# Patient Record
Sex: Male | Born: 1950 | Race: White | Hispanic: No | Marital: Married
Health system: Southern US, Community
[De-identification: ages and names within clinical notes are randomized; demographics above are authoritative.]

## PROBLEM LIST (undated history)

## (undated) DIAGNOSIS — C4491 Basal cell carcinoma of skin, unspecified: Secondary | ICD-10-CM

## (undated) DIAGNOSIS — L570 Actinic keratosis: Secondary | ICD-10-CM

## (undated) HISTORY — DX: Basal cell carcinoma of skin, unspecified: C44.91

## (undated) HISTORY — DX: Actinic keratosis: L57.0

## (undated) HISTORY — PX: OTHER SURGICAL HISTORY: SHX169

---

## 2013-01-23 ENCOUNTER — Ambulatory Visit: Payer: Self-pay | Admitting: Sports Medicine

## 2013-02-24 ENCOUNTER — Ambulatory Visit: Payer: Self-pay | Admitting: Orthopedic Surgery

## 2013-02-24 LAB — PROTIME-INR: Prothrombin Time: 13.4 secs (ref 11.5–14.7)

## 2018-07-19 ENCOUNTER — Other Ambulatory Visit: Payer: Self-pay | Admitting: Sports Medicine

## 2018-07-19 DIAGNOSIS — M7918 Myalgia, other site: Secondary | ICD-10-CM

## 2018-07-19 DIAGNOSIS — M25552 Pain in left hip: Secondary | ICD-10-CM

## 2018-07-19 DIAGNOSIS — M7072 Other bursitis of hip, left hip: Secondary | ICD-10-CM

## 2018-08-09 ENCOUNTER — Ambulatory Visit
Admission: RE | Admit: 2018-08-09 | Discharge: 2018-08-09 | Disposition: A | Discharge status: Home / Self Care | Payer: Medicare Other | Source: Ambulatory Visit | Attending: Sports Medicine | Admitting: Sports Medicine

## 2018-08-09 ENCOUNTER — Encounter (INDEPENDENT_AMBULATORY_CARE_PROVIDER_SITE_OTHER): Payer: Self-pay

## 2018-08-09 DIAGNOSIS — M25552 Pain in left hip: Secondary | ICD-10-CM | POA: Diagnosis not present

## 2018-08-09 DIAGNOSIS — S73102A Unspecified sprain of left hip, initial encounter: Secondary | ICD-10-CM | POA: Diagnosis not present

## 2018-08-09 DIAGNOSIS — M7918 Myalgia, other site: Secondary | ICD-10-CM

## 2018-08-09 DIAGNOSIS — X58XXXA Exposure to other specified factors, initial encounter: Secondary | ICD-10-CM | POA: Diagnosis not present

## 2018-08-09 DIAGNOSIS — M7072 Other bursitis of hip, left hip: Secondary | ICD-10-CM | POA: Diagnosis not present

## 2019-08-23 ENCOUNTER — Other Ambulatory Visit: Payer: Self-pay | Admitting: Orthopedic Surgery

## 2019-08-23 DIAGNOSIS — M25552 Pain in left hip: Secondary | ICD-10-CM

## 2019-08-25 ENCOUNTER — Ambulatory Visit
Admission: RE | Admit: 2019-08-25 | Discharge: 2019-08-25 | Disposition: A | Discharge status: Home / Self Care | Payer: Medicare HMO | Source: Ambulatory Visit | Attending: Orthopedic Surgery | Admitting: Orthopedic Surgery

## 2019-08-25 ENCOUNTER — Ambulatory Visit: Payer: PRIVATE HEALTH INSURANCE

## 2019-08-25 ENCOUNTER — Other Ambulatory Visit: Payer: Self-pay

## 2019-08-25 DIAGNOSIS — M25552 Pain in left hip: Secondary | ICD-10-CM | POA: Diagnosis not present

## 2019-08-25 MED ORDER — TRIAMCINOLONE ACETONIDE 40 MG/ML IJ SUSP (RADIOLOGY)
40.0000 mg | Freq: Once | INTRAMUSCULAR | Status: AC
  Administered 2019-08-25: 11:00:00 40 mg via INTRA_ARTICULAR

## 2019-08-25 MED ORDER — ROPIVACAINE HCL 5 MG/ML IJ SOLN
15.0000 mL | Freq: Once | INTRAMUSCULAR | Status: AC
  Administered 2019-08-25: 11:00:00 8 mL via INTRA_ARTICULAR
  Filled 2019-08-25: qty 15

## 2019-08-25 MED ORDER — LIDOCAINE HCL (PF) 1 % IJ SOLN
5.0000 mL | Freq: Once | INTRAMUSCULAR | Status: AC
  Administered 2019-08-25: 11:00:00 5 mL
  Filled 2019-08-25: qty 5

## 2019-08-25 MED ORDER — IOHEXOL 180 MG/ML  SOLN
20.0000 mL | Freq: Once | INTRAMUSCULAR | Status: AC | PRN
  Administered 2019-08-25: 11:00:00 2 mL via INTRA_ARTICULAR

## 2019-09-22 ENCOUNTER — Other Ambulatory Visit: Payer: Self-pay | Admitting: Orthopedic Surgery

## 2019-09-22 DIAGNOSIS — M25552 Pain in left hip: Secondary | ICD-10-CM

## 2019-09-27 ENCOUNTER — Other Ambulatory Visit: Payer: Self-pay

## 2019-09-27 ENCOUNTER — Ambulatory Visit
Admission: RE | Admit: 2019-09-27 | Discharge: 2019-09-27 | Disposition: A | Discharge status: Home / Self Care | Payer: Medicare HMO | Source: Ambulatory Visit | Attending: Orthopedic Surgery | Admitting: Orthopedic Surgery

## 2019-09-27 ENCOUNTER — Ambulatory Visit: Payer: Medicare HMO

## 2019-09-27 DIAGNOSIS — M25552 Pain in left hip: Secondary | ICD-10-CM

## 2019-09-27 MED ORDER — IOHEXOL 300 MG/ML  SOLN
1.0000 mL | Freq: Once | INTRAMUSCULAR | Status: AC | PRN
  Administered 2019-09-27: 1 mL

## 2019-09-27 MED ORDER — ROPIVACAINE HCL 5 MG/ML IJ SOLN
INTRAMUSCULAR | Status: AC
  Filled 2019-09-27: qty 30

## 2019-09-27 MED ORDER — TRIAMCINOLONE ACETONIDE 40 MG/ML IJ SUSP
INTRAMUSCULAR | Status: AC
  Filled 2019-09-27: qty 1

## 2019-09-27 MED ORDER — TRIAMCINOLONE ACETONIDE 40 MG/ML IJ SUSP
40.0000 mg | Freq: Once | INTRAMUSCULAR | Status: AC
  Administered 2019-09-27: 15:00:00 40 mg via INTRAMUSCULAR
  Filled 2019-09-27: qty 1

## 2019-09-27 MED ORDER — ROPIVACAINE HCL 5 MG/ML IJ SOLN
2.0000 mL | Freq: Once | INTRAMUSCULAR | Status: AC
  Administered 2019-09-27: 2 mL
  Filled 2019-09-27: qty 2

## 2020-07-31 ENCOUNTER — Ambulatory Visit: Payer: Medicare HMO | Admitting: Dermatology

## 2020-08-13 ENCOUNTER — Ambulatory Visit: Payer: Medicare HMO | Admitting: Dermatology

## 2020-08-13 ENCOUNTER — Encounter: Payer: Self-pay | Admitting: Dermatology

## 2020-08-13 ENCOUNTER — Other Ambulatory Visit: Payer: Self-pay

## 2020-08-13 DIAGNOSIS — L820 Inflamed seborrheic keratosis: Secondary | ICD-10-CM | POA: Diagnosis not present

## 2020-08-13 DIAGNOSIS — D1800 Hemangioma unspecified site: Secondary | ICD-10-CM

## 2020-08-13 DIAGNOSIS — Z1283 Encounter for screening for malignant neoplasm of skin: Secondary | ICD-10-CM

## 2020-08-13 DIAGNOSIS — D239 Other benign neoplasm of skin, unspecified: Secondary | ICD-10-CM

## 2020-08-13 DIAGNOSIS — L814 Other melanin hyperpigmentation: Secondary | ICD-10-CM

## 2020-08-13 DIAGNOSIS — D692 Other nonthrombocytopenic purpura: Secondary | ICD-10-CM | POA: Diagnosis not present

## 2020-08-13 DIAGNOSIS — D229 Melanocytic nevi, unspecified: Secondary | ICD-10-CM

## 2020-08-13 DIAGNOSIS — L578 Other skin changes due to chronic exposure to nonionizing radiation: Secondary | ICD-10-CM

## 2020-08-13 DIAGNOSIS — D2372 Other benign neoplasm of skin of left lower limb, including hip: Secondary | ICD-10-CM

## 2020-08-13 DIAGNOSIS — Z85828 Personal history of other malignant neoplasm of skin: Secondary | ICD-10-CM | POA: Diagnosis not present

## 2020-08-13 DIAGNOSIS — L821 Other seborrheic keratosis: Secondary | ICD-10-CM

## 2020-08-13 NOTE — Progress Notes (Signed)
Follow-Up Visit   Subjective  Alejandro Williamson is a 69 y.o. male who presents for the following: TBSE.  Patient presents today for TBSE, has a few areas of concern on B/L forearms (bruising), back, chest, and B/L legs. Patient has h/o [facility] 11/18/2009 R. Medial forehead @sup  edge of previous BCC, BCC Right Medial forehead 04/09/09.  New spot on L chest also irritated spot behind R ear.  He takes aspirin daily and had had multiple steroid shots for hip over past year.  The following portions of the chart were reviewed this encounter and updated as appropriate:      Review of Systems:  No other skin or systemic complaints except as noted in HPI or Assessment and Plan.  Objective  Well appearing patient in no apparent distress; mood and affect are within normal limits.  A full examination was performed including scalp, head, eyes, ears, nose, lips, neck, chest, axillae, abdomen, back, buttocks, bilateral upper extremities, bilateral lower extremities, hands, feet, fingers, toes, fingernails, and toenails. All findings within normal limits unless otherwise noted below.  Objective  Left Forearm - Anterior, Right Forearm - Anterior: Violaceous macules and patches.   Objective  Left Ankle: Firm pink/brown papulenodule with dimple sign.   Objective  Left Breast, Right Postauricular Area: Erythematous keratotic or waxy stuck-on papule    Assessment & Plan  Senile purpura (HCC) (2) Left Forearm - Anterior; Right Forearm - Anterior  Purpura - Violaceous macules and patches arms - Benign - Related to age, sun damage and/or use of blood thinners, also due to systemic steroids (h/o steroid shots for hip) - Observe, Recommend daily broad spectrum sunscreen SPF 30+ to sun-exposed areas - Can use OTC arnica containing moisturizer such as Dermend Bruise Formula if desired - Call for worsening or other concerns   Dermatofibroma Left Ankle  Benign, observe.    Inflamed seborrheic keratosis  (2) Left Breast; Right Postauricular Area  Cryotherapy today Prior to procedure, discussed risks of blister formation, small wound, skin dyspigmentation, or rare scar following cryotherapy.    Destruction of lesion - Left Breast, Right Postauricular Area  Destruction method: cryotherapy   Informed consent: discussed and consent obtained   Lesion destroyed using liquid nitrogen: Yes   Region frozen until ice ball extended beyond lesion: Yes   Outcome: patient tolerated procedure well with no complications   Post-procedure details: wound care instructions given     Lentigines - Scattered tan macules - Discussed due to sun exposure - Benign, observe - Call for any changes  Seborrheic Keratoses - Stuck-on, waxy, tan-brown papules and plaques  - Discussed benign etiology and prognosis. - Observe - Call for any changes  Melanocytic Nevi - Tan-brown and/or pink-flesh-colored symmetric macules and papules - Benign appearing on exam today - Observation - Call clinic for new or changing moles - Recommend daily use of broad spectrum spf 30+ sunscreen to sun-exposed areas.   Hemangiomas - Red papules - Discussed benign nature - Observe - Call for any changes  Actinic Damage - diffuse scaly erythematous macules with underlying dyspigmentation - Recommend daily broad spectrum sunscreen SPF 30+ to sun-exposed areas, reapply every 2 hours as needed.  - Call for new or changing lesions.  History of Basal Cell Carcinoma of the Forehead - No evidence of recurrence today - Recommend regular full body skin exams - Recommend daily broad spectrum sunscreen SPF 30+ to sun-exposed areas, reapply every 2 hours as needed.  - Call if any new or changing lesions are  noted between office visits  Skin cancer screening performed today.   Return in about 1 year (around 08/13/2021) for TBSE.  I, Donzetta Kohut, CMA, am acting as scribe for Brendolyn Patty, MD . Documentation: I have reviewed the  above documentation for accuracy and completeness, and I agree with the above.  Brendolyn Patty MD

## 2020-08-13 NOTE — Patient Instructions (Addendum)

## 2021-01-14 ENCOUNTER — Other Ambulatory Visit: Payer: Self-pay | Admitting: Sports Medicine

## 2021-01-14 DIAGNOSIS — M76891 Other specified enthesopathies of right lower limb, excluding foot: Secondary | ICD-10-CM

## 2021-01-14 DIAGNOSIS — M7072 Other bursitis of hip, left hip: Secondary | ICD-10-CM

## 2021-01-14 DIAGNOSIS — M25552 Pain in left hip: Secondary | ICD-10-CM

## 2021-01-14 DIAGNOSIS — M1612 Unilateral primary osteoarthritis, left hip: Secondary | ICD-10-CM

## 2021-01-14 DIAGNOSIS — M76899 Other specified enthesopathies of unspecified lower limb, excluding foot: Secondary | ICD-10-CM

## 2021-01-20 ENCOUNTER — Other Ambulatory Visit: Payer: Self-pay

## 2021-01-20 ENCOUNTER — Ambulatory Visit
Admission: RE | Admit: 2021-01-20 | Discharge: 2021-01-20 | Disposition: A | Discharge status: Home / Self Care | Payer: Medicare HMO | Source: Ambulatory Visit | Attending: Sports Medicine | Admitting: Sports Medicine

## 2021-01-20 DIAGNOSIS — M76892 Other specified enthesopathies of left lower limb, excluding foot: Secondary | ICD-10-CM | POA: Insufficient documentation

## 2021-01-20 DIAGNOSIS — M1612 Unilateral primary osteoarthritis, left hip: Secondary | ICD-10-CM | POA: Insufficient documentation

## 2021-01-20 DIAGNOSIS — M25552 Pain in left hip: Secondary | ICD-10-CM | POA: Diagnosis not present

## 2021-01-20 DIAGNOSIS — M76891 Other specified enthesopathies of right lower limb, excluding foot: Secondary | ICD-10-CM | POA: Insufficient documentation

## 2021-01-20 DIAGNOSIS — M7072 Other bursitis of hip, left hip: Secondary | ICD-10-CM | POA: Diagnosis present

## 2021-01-20 DIAGNOSIS — M76899 Other specified enthesopathies of unspecified lower limb, excluding foot: Secondary | ICD-10-CM | POA: Insufficient documentation

## 2021-01-28 ENCOUNTER — Other Ambulatory Visit: Payer: Self-pay

## 2021-01-28 ENCOUNTER — Encounter: Payer: Self-pay | Admitting: Physical Therapy

## 2021-01-28 ENCOUNTER — Ambulatory Visit: Payer: Medicare HMO | Attending: Sports Medicine | Admitting: Physical Therapy

## 2021-01-28 DIAGNOSIS — M533 Sacrococcygeal disorders, not elsewhere classified: Secondary | ICD-10-CM | POA: Diagnosis not present

## 2021-01-28 DIAGNOSIS — M2170 Unequal limb length (acquired), unspecified site: Secondary | ICD-10-CM | POA: Insufficient documentation

## 2021-01-28 DIAGNOSIS — R2689 Other abnormalities of gait and mobility: Secondary | ICD-10-CM | POA: Insufficient documentation

## 2021-01-28 NOTE — Therapy (Signed)
[place] MAIN [facility] SERVICES [address] [place], [place], [address] Phone: [phone]   Fax:  [phone]  Physical Therapy Evaluation  Patient Details  Name: Alejandro Williamson MRN: [id number] Date of Birth: 1951-06-12 Referring Provider (PT): Candelaria Stagers MD   Encounter Date: 01/28/2021   PT End of Session - 01/28/21 1938    Visit Number 1    Number of Visits 10   eval 01/28/21   Date for PT Re-Evaluation 04/08/21    PT Start Time 1700    PT Stop Time 1820    PT Time Calculation (min) 80 min           Past Medical History:  Diagnosis Date  . Basal cell carcinoma 04/09/2009, 11/18/2009   right med forehead 3cm sup to right medial brow    History reviewed. No pertinent surgical history.  There were no vitals filed for this visit.    Subjective Assessment - 01/28/21 1713    Subjective 1)  L posterior thigh pain: Pt started to notice L hip / buttock in 2019 like "he was sitting on a rock".   It came on suddenly after he bough a new car ( SUV) and all of a sudden he could not sit in it after a long drive from [place] ( 5 hours). He experienced radiating along pelvic area and L glut.  Pain occurred with sitting any surfaces that had a give to it and he sinks into the seat. Pain resolves with standing. Pain also occurred with lying flat or when buttocks get compressed in a certain way ( crossing ankles while watching TV on his back).  Pain a resolves when sitting on his toilet.   Denied urinary frequency, urgency, difficulty with initiating / completing urination, constipation.  Denied numbness/ tingling along pelvic area. Denied fall onto taibone, stepping into ditch/ curb, sprained ankles.   But pt noticed around the same time his L posterior thigh pain has been occuring, he would experience numbness along  B thighs above knee after standing for long periods 30-40 min.    Across the past 3 years, Sx have remained at a level of 4-10/10. Pt drove his  wife car and pain was increased to 10/10 but it decreased after 4 days to 4-5/10.  Pt adjusted his activities due to pain. Pt has missed church because he is not able to sit through a whole service , not drove to [place] for 5 hours to see his grandparents. Pt's wife depends on him for driving.  Pt received 20 visits of physical therapy and experienced 50% -70% relief and pt still does his HEP but one exercise with stepping caused L labral pain.   2) Recurrent L labral pain along thigh to knee:       L labral tear occurred in 2017 with pain along lateral leg which he treated with medications and did not undergo PT. The medications helped with the labral pain. It flares up every now and then. Flare-ups 5-6/10, currently 1/10 Hx of running for 5 miles / day between [age] years but did not have a flexibility routine.  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 One exercise with stepping prescribed by another PT caused L labral pain. Physical activity: 10K steps with wife.     Patient Stated Goals be able to drive to [facility] and see grandkids    Currently in Pain? Yes    Pain Score 3  Pain Location Buttocks    Pain Descriptors / Indicators Aching;Burning              [facility] PT Assessment - 01/28/21 1711      Assessment   Medical Diagnosis L thigh posterior pain    Referring Provider (PT) Candelaria Stagers MD      Precautions   Precautions None      Restrictions   Weight Bearing Restrictions No      Balance Screen   Has the patient fallen in the past 6 months Yes   fell on L side on ice, MDs have examined it, it is getting better   How many times? 1   three weeks ago   Has the patient had a decrease in activity level because of a fear of falling?  No    Is the patient reluctant to leave their home because of a fear of falling?  No      Prior Function   Level of Independence Independent      Observation/Other Assessments   Observations ankles crossed, sacral sitting. Provided folded  towel for anterior tilt and cued for feet on ground: pt reported discomfort but no pain.    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, L ilac crest higher, R thorax anterior rotation      AROM   Overall AROM Comments L sidebend and rotation less > R ( post Tx: shoe lift in L shoe)      PROM   Overall PROM Comments sidelying hip hip measurement: R hip ext 10 deg, L 20 deg      Strength   Overall Strength Comments R hip abd/ glut, hip flexion/ DF/EV and toe ext  3+/5, L 4+/5, knee ext/flex B 4+/5      Palpation   Spinal mobility hypomobile thoracic segments T7-12, R teres minor /medial scapula tight ( post Tx: improved arm swing/ thoracic rotation in gait)    SI assessment  standing: L iliac crest / shoulder higher    Palpation comment supine: B ASIS levelled, medial malleoli/ patella L higher ( no change in standing/ supine after addressing thoracic spinal deviations)      Ambulation/Gait   Gait Comments pre Tx: limited R anterior rotation with swing, L pelvic  sway, limited arm swing/ thoracic rotation postTx: more arm swing/ pelvic sway still present without shoe lift,  reciporcal gait pattern armswings with shoe lift in L shoe                      Objective measurements completed on examination: See above findings.       [place] Adult PT Treatment/Exercise - 01/28/21 1711      Therapeutic Activites    Other Therapeutic Activities explaiend role of pelvic alignment and spinal deviations to Sx, explained anatomy/ physiology, goals, role of shoe lift to adjust for leg length difference, provided sitting posture modifications with folded towel to promote anterior tilt of pelvis instead of slumped sitting.       Neuro Re-ed    Neuro Re-ed Details  cued for HEP, sitting posture,      Manual Therapy   Manual therapy comments STM/MWM to address hypomobile thoracic segments T7-12, R teres minor /medial scapula tight ( post Tx: improved arm swing/ thoracic rotation in gait)                        PT Long Term Goals - 01/28/21 1723  PT LONG TERM GOAL #1   Title Pt will be able to sit for 30 min on a hard surface in order to prepare to return to church and sit through a whole service    Baseline no able to sit 30 min    Time 8    Period Weeks    Status New    Target Date 03/25/21      PT LONG TERM GOAL #2   Title Pt will report no  numbness along  B thighs above knee after standing for long periods 30-40 min in order to perform ADLs    Baseline numbness along  B thighs above knee after standing for long periods 30-40 min    Time 8    Period Weeks    Status New    Target Date 03/25/21      PT LONG TERM GOAL #3   Title Pt will be IND with car seat modifications in order to ride in variable car seat s in order to engage in voluteer activities and support wife who depends on him for driving    Baseline not IND    Time 4    Period Weeks    Status New    Target Date 02/25/21      PT LONG TERM GOAL #4   Title Pt will decrease his FOTO pain score from 50 pts to < 25 pts in order to improve QOL    Baseline 50pts    Time 10    Period Weeks    Status New    Target Date 04/08/21      PT LONG TERM GOAL #5   Title Pt will demo equal pelvic girdle height and less spinal deviations, and more reciprocal gait pattern    Baseline L illiac crest higher,  limited R anterior pelvic rotation with swing phase    Time 2    Period Weeks    Status New    Target Date 02/11/21      Additional Long Term Goals   Additional Long Term Goals Yes      PT LONG TERM GOAL #6   Title Pt will demo increased strength on RLE from 3+/5 to > 4/5 hip flexion, hip abd, hip ext, and increased hip ext from 10 deg to > 20 deg ( L 20 deg) ( tested in sidelying) in order to walk 10K steps with wife    Baseline 3+/5 hip flexion, hip abd, hip ext    Time 6    Period Weeks    Status New    Target Date 03/11/21                  Plan - 01/28/21 1941    Clinical  Impression Statement Pt is a 70 yo who presents with L glut pain since 2019 which has impacted his ability to participate in social activities such as sitting through a church service and riding to visit grandchildren who live 5 hours away.   Pt completed 20 visits of physical therapy which he reports to have helped with his pain by 50-75% pain.   Pt also presents with recurrent L hip pain related to  L labral tear that occurred in 2017.  Pt's musculoskeletal assessment today revealed unequal leg length height, uneven pelvic girdle and shoulder height, limited spinal /pelvic mobility, dyscoordination and strength of pelvic floor mm, weak mm on RLE, and gait deviations, and poor sitting posture.   Pt was provided education on  etiology of Sx with anatomy, physiology explanation with images along with the benefits of customized pelvic PT Tx based on pt's medical conditions and musculoskeletal deficits.  Explained the importance of restoring equal pelvic girdle alignment and its impact on sitting and gait.   Regional interdependent approaches will yield greater benefits in pt's POC due to the complexity of pt's medical Hx and the significant impact their Sx have had on their QOL. Pt would benefit from a biopsychosocial approach to yield optimal outcomes as pt just started medication to help with the stress this Sx has had on his life. Plan to build interdisciplinary team with pt's providers to optimize patient-centered care.   Following Tx today which pt tolerated without complaints, pt demo'd increased thoracic rotation/ mobility with increased arm swing following manual Tx at thoracic spine.  He demo'd equal alignment of pelvic girdle with a shoe lift placed in L shoe and a more reciprocal gait pattern. Pt was educated to sit with anterior tilt of pelvis with a folded towel which helped to decrease his pain.  Plan to monitor the effective of shoe lift and show modifications to car seats at next session.   Anticipate by addressing leg length difference, L glut pain and L labral tear related pain may improve further.   Customized manual Tx, propioception and neuromuscular training, and specific strengthening, plus neuroscience pain education will add to pt's prognosis.     Examination-Activity Limitations Stand;Sit    Clinical Decision Making Moderate    Rehab Potential Good    PT Frequency 1x / week    PT Duration Other (comment)   10   PT Treatment/Interventions ADLs/Self Care Home Management;Stair training;Therapeutic activities;Therapeutic exercise;Moist Heat;Neuromuscular re-education;Balance training;Scar mobilization;Manual techniques;Functional mobility training;Traction;Cryotherapy;Dry needling;Patient/family education;Compression bandaging;Taping;Splinting;Gait training;Energy conservation;Spinal Manipulations;Joint Manipulations    Consulted and Agree with Plan of Care Patient           Patient will benefit from skilled therapeutic intervention in order to improve the following deficits and impairments:  Hypomobility,Decreased strength,Decreased range of motion,Decreased endurance,Increased muscle spasms,Decreased scar mobility,Decreased mobility,Decreased coordination,Abnormal gait,Decreased activity tolerance,Improper body mechanics,Pain,Postural dysfunction  Visit Diagnosis: Sacrococcygeal disorders, not elsewhere classified  Unequal leg length  Other abnormalities of gait and mobility     Problem List There are no problems to display for this patient.   Jerl Mina 01/28/2021, 8:03 PM  [place] MAIN [facility] SERVICES [address] [place], [place], [address] Phone: [phone]   Fax:  [phone]  Name: Alejandro Williamson MRN: [id number] Date of Birth: November 09, 1951

## 2021-01-28 NOTE — Patient Instructions (Signed)
  Lengthen Back rib by R  shoulder    Lie on L  side , pillow between knees and under head  Pull  arm overhead over mattress, grab the edge of mattress,pull it upward, drawing elbow away from ears  Breathing 10 reps  Open book (handout)  Lying on  _ side , rotating  __ only this week  Rotating onto pillow /yoga block  Pillow/ Block between knees  10 reps  __   Wear shoe lift in L shoe and if causes pain, remove it

## 2021-01-30 ENCOUNTER — Encounter: Payer: Self-pay | Admitting: Physical Therapy

## 2021-01-30 NOTE — Telephone Encounter (Signed)
DPT phoned patient and listened to his report of R radiating pain down RLE since wearing shoe lift in L shoe. Pt was advised to remove the shoe and asked pt to email DPT an update.  His next appt was expedited to Monday instead of Thursday of next week. Pt voiced understanding.

## 2021-02-03 ENCOUNTER — Ambulatory Visit: Payer: Medicare HMO | Admitting: Physical Therapy

## 2021-02-03 ENCOUNTER — Other Ambulatory Visit: Payer: Self-pay

## 2021-02-03 DIAGNOSIS — M533 Sacrococcygeal disorders, not elsewhere classified: Secondary | ICD-10-CM | POA: Diagnosis not present

## 2021-02-03 DIAGNOSIS — R2689 Other abnormalities of gait and mobility: Secondary | ICD-10-CM

## 2021-02-03 NOTE — Patient Instructions (Addendum)
    Neck / shoulder stretches:    Lying on back - small sushi roll towel under neck  _ 6 directions  5 reps    _angel wings, lower elbows down , keep arms touching bed  10 reps       __  Avoid straining pelvic floor, abdominal muscles , spine  Use log rolling technique instead of getting out of bed with your neck or the sit-up     Log rolling into and out of bed   Log rolling into and out of bed If getting out of bed on R side, Bent knees, scoot hips/ shoulder to L  Raise R arm completely overhead, rolling onto armpit  Then lower bent knees to bed to get into complete side lying position  Then drop legs off bed, and push up onto R elbow/forearm, and use L hand to push onto the bed

## 2021-02-03 NOTE — Therapy (Signed)
[place] MAIN [facility] SERVICES [address] [place], [place], [address] Phone: [phone]   Fax:  [phone]  Physical Therapy Treatment  Patient Details  Name: Alejandro Williamson MRN: [id number] Date of Birth: 1951-05-21 Referring Provider (PT): Candelaria Stagers MD   Encounter Date: 02/03/2021   PT End of Session - 02/03/21 0858    Visit Number 2    Number of Visits 10   eval 01/28/21   Date for PT Re-Evaluation 04/08/21    PT Start Time 0900    PT Stop Time 1000    PT Time Calculation (min) 60 min           Past Medical History:  Diagnosis Date  . Basal cell carcinoma 04/09/2009, 11/18/2009   right med forehead 3cm sup to right medial brow    No past surgical history on file.  There were no vitals filed for this visit.   Subjective Assessment - 02/03/21 0909    Subjective The day after wearing shoelift on L shoe, pt experienced radiating pain down the R leg. After he removed it, he felt no more radiating pain. The past 2 days pt felt better with sitting by 20% but he also didn't sit much.  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, L lumbar convex curve , L ilac crest higher, R thorax anterior rotation      AROM   Overall AROM Comments R cervical rotation: 50 deg, L 55 deg seated,  supine,: L cervical rotation 45 deg/ 60, R 40/ 50  deg      Palpation   Spinal mobility signficant tightness at medial scapula, teres minor/ subscap, hypmobile T7-10 , T3-5, tightness/ upertrap B, hypomobile C5-7 and paraspinals from cervical spine to thoracic B      Special Tests   Other special tests functional reach in prone: R thumb at L 3, L thumb at L 1      Ambulation/Gait   Gait Comments without shoe lift: decreased stance on R, slightly lowered R  shoulder, speed 1.3 m/s  after HEP for thoracic deficits and removeal of shoe lift                         [facility] Adult PT Treatment/Exercise - 02/03/21 1660      Neuro Re-ed    Neuro Re-ed Details  cued for HEP , educated on no longer needing shoe lift      Manual Therapy   Manual therapy comments STM/MWM at problem areas noted in assessment ( sidelying, prone positions. Further manual Tx at cervial spine is needed next sessino                       PT Long Term Goals - 02/03/21 0910      PT LONG TERM GOAL #1   Title Pt will be able to sit for 30 min on a hard surface in order to prepare to return to church and sit through a whole service    Baseline no able to sit 30 min    Time 8    Period Weeks    Status On-going  PT LONG TERM GOAL #2   Title Pt will report no  numbness along  B thighs above knee after standing for long periods 30-40 min in order to perform ADLs    Baseline numbness along  B thighs above knee after standing for long periods 30-40 min    Time 8    Period Weeks    Status On-going      PT LONG TERM GOAL #3   Title Pt will be IND with car seat modifications in order to ride in variable car seat s in order to engage in voluteer activities and support wife who depends on him for driving    Baseline not IND    Time 4    Period Weeks    Status On-going      PT LONG TERM GOAL #4   Title Pt will decrease his FOTO pain score from 50 pts to < 25 pts in order to improve QOL    Baseline 50pts    Time 10    Period Weeks    Status On-going      PT LONG TERM GOAL #5   Title Pt will demo equal pelvic girdle height and less spinal deviations, and more reciprocal gait pattern    Baseline L illiac crest higher,  limited R anterior pelvic rotation with swing phase    Time 2    Period Weeks    Status On-going      Additional Long Term Goals   Additional Long Term Goals Yes      PT LONG TERM GOAL #6   Title Pt will demo increased  strength on RLE from 3+/5 to > 4/5 hip flexion, hip abd, hip ext, and increased hip ext from 10 deg to > 20 deg ( L 20 deg) ( tested in sidelying) in order to walk 10K steps with wife    Baseline 3+/5 hip flexion, hip abd, hip ext    Time 6    Period Weeks    Status On-going      PT LONG TERM GOAL #7   Title Flare ups with pain decrease from   3-4 x a week  to < 2-3 x week    Time 6    Period Weeks    Status New    Target Date 03/17/21                 Plan - 02/03/21 0953    Clinical Impression Statement Shoe lift has been removed as it caused radiating R LE. Therapist communicated with pt over the phone when pt reported through My Chart about adverse effects. Pt removed it for the past two days and had no more radiating pain on R LE.   It had been mistakenly put in L shoe. Pt continued to perform the thoracic stretches.  Suspect his issue is related to spinal deviations. Thus, today,  further manual Tx was provided at thoracic and cervical spine R > L to minimize thoracolumbar curves and increased mm tightness along paraspinals R/ medial scapular mm R, and B cervical mm.  This Tx helped to level out the pelvis . Therefore, r/o true leg length difference as spinal musculature asymmetries being addressed helped to promote more levelled shoulder/ pelvic height. Pt was cued to for more armswing, trunk rotation. Pt showed increased cervical rotation to R post Tx. Cervicothoracic hypmobility was likely due to excessive sit-ups for many years m contributing to thoracic kyphosis/ forward head posture and limited cervical rotation.  Educated pt to to be aware of whether he leans on the car console when driving which can contribute to the R sided mm shortening. Pt continues to benefit from skilled PT.    Examination-Activity Limitations Stand;Sit    Rehab Potential Good    PT Frequency 1x / week    PT Duration Other (comment)   10   PT Treatment/Interventions ADLs/Self Care Home Management;Stair  training;Therapeutic activities;Therapeutic exercise;Moist Heat;Neuromuscular re-education;Balance training;Scar mobilization;Manual techniques;Functional mobility training;Traction;Cryotherapy;Dry needling;Patient/family education;Compression bandaging;Taping;Splinting;Gait training;Energy conservation;Spinal Manipulations;Joint Manipulations    Consulted and Agree with Plan of Care Patient           Patient will benefit from skilled therapeutic intervention in order to improve the following deficits and impairments:  Hypomobility,Decreased strength,Decreased range of motion,Decreased endurance,Increased muscle spasms,Decreased scar mobility,Decreased mobility,Decreased coordination,Abnormal gait,Decreased activity tolerance,Improper body mechanics,Pain,Postural dysfunction  Visit Diagnosis: Sacrococcygeal disorders, not elsewhere classified  Other abnormalities of gait and mobility     Problem List There are no problems to display for this patient.   Jerl Mina ,PT, DPT, E-RYT  02/03/2021, 10:54 AM  [place] MAIN [facility] SERVICES [address] [place], [place], [address] Phone: [phone]   Fax:  [phone]  Name: Alejandro Williamson MRN: [id number] Date of Birth: 1951-06-30

## 2021-02-06 ENCOUNTER — Other Ambulatory Visit: Payer: Self-pay

## 2021-02-06 ENCOUNTER — Ambulatory Visit: Payer: Medicare HMO | Admitting: Physical Therapy

## 2021-02-06 DIAGNOSIS — M533 Sacrococcygeal disorders, not elsewhere classified: Secondary | ICD-10-CM

## 2021-02-06 DIAGNOSIS — M2170 Unequal limb length (acquired), unspecified site: Secondary | ICD-10-CM

## 2021-02-06 DIAGNOSIS — R2689 Other abnormalities of gait and mobility: Secondary | ICD-10-CM

## 2021-02-06 NOTE — Patient Instructions (Signed)
Stretch for pelvic floor    Twice a day    On belly: Riding horse edge of mattress  knee bent like riding a horse, move knee towards armpit and out  10 reps     childs poses rocking   Toes tucked, shoulders down and back, on forearms , hands / hips shoulder width apart  10 reps    __   modify pillows to not have forward head posture when lying on your bed to watch TV,

## 2021-02-07 NOTE — Therapy (Addendum)
[place] MAIN [facility] SERVICES [address] [place], [place], [address] Phone: [phone]   Fax:  [phone]  Physical Therapy Treatment  Patient Details  Name: Alejandro Williamson MRN: [id number] Date of Birth: [birth year] Referring Provider (PT): Candelaria Stagers MD   Encounter Date: 02/06/2021   PT End of Session - 02/06/21 1659    Visit Number 3    Number of Visits 10   eval 01/28/21   Date for PT Re-Evaluation 04/08/21    PT Start Time 1600    PT Stop Time 1657    PT Time Calculation (min) 57 min    Activity Tolerance Patient tolerated treatment well    Behavior During Therapy [facility] for tasks assessed/performed           Past Medical History:  Diagnosis Date  . Basal cell carcinoma 04/09/2009, 11/18/2009   right med forehead 3cm sup to right medial brow    No past surgical history on file.  There were no vitals filed for this visit.   Subjective Assessment - 02/06/21 1604    Subjective Pt is sitting in chairs more than sofas. Pt still has the issue in the L glut but the intensity is 10%  less.  The trip to [place] was better on the way than on the way back because that was because he had to sit at teh car [employer] for 2 hours and did walk some. Pt felt good about the 30 min drive one way. In the past, the L glut pain was worse.    Patient Stated Goals be able to drive to [facility] and see grandkids              [facility] PT Assessment - 02/07/21 1156      Palpation   Spinal mobility throacic spine hypombile, medial scapula mm / posterior intercostals B tightness                         [facility] Adult PT Treatment/Exercise - 02/07/21 1207      Ambulation/Gait   Gait velocity 1.2 m/s    Gait Comments reciporcal gait with more arm swings      Therapeutic Activites    Other Therapeutic Activities explained posture when watching TV to minimize forward head , explained thoracic spine/ cervical spine affecting L hip       Neuro Re-ed    Neuro Re-ed Details  cued for posture      Manual Therapy   Manual therapy comments STM/MWM PA mob at thoracic spine/ medial scapula mm B                       PT Long Term Goals - 02/03/21 0910      PT LONG TERM GOAL #1   Title Pt will be able to sit for 30 min on a hard surface in order to prepare to return to church and sit through a whole service    Baseline no able to sit 30 min    Time 8    Period Weeks    Status On-going      PT LONG TERM GOAL #2   Title Pt will report no  numbness along  B thighs above knee after standing for long periods 30-40 min in order to perform ADLs    Baseline numbness along  B thighs above knee after standing for long periods 30-40 min    Time 8  Period Weeks    Status On-going      PT LONG TERM GOAL #3   Title Pt will be IND with car seat modifications in order to ride in variable car seat s in order to engage in voluteer activities and support wife who depends on him for driving    Baseline not IND    Time 4    Period Weeks    Status On-going      PT LONG TERM GOAL #4   Title Pt will decrease his FOTO pain score from 50 pts to < 25 pts in order to improve QOL    Baseline 50pts    Time 10    Period Weeks    Status On-going      PT LONG TERM GOAL #5   Title Pt will demo equal pelvic girdle height and less spinal deviations, and more reciprocal gait pattern    Baseline L illiac crest higher,  limited R anterior pelvic rotation with swing phase    Time 2    Period Weeks    Status On-going      Additional Long Term Goals   Additional Long Term Goals Yes      PT LONG TERM GOAL #6   Title Pt will demo increased strength on RLE from 3+/5 to > 4/5 hip flexion, hip abd, hip ext, and increased hip ext from 10 deg to > 20 deg ( L 20 deg) ( tested in sidelying) in order to walk 10K steps with wife    Baseline 3+/5 hip flexion, hip abd, hip ext    Time 6    Period Weeks    Status On-going      PT LONG TERM GOAL  #7   Title Flare ups with pain decrease from   3-4 x a week  to < 2-3 x week    Time 6    Period Weeks    Status New    Target Date 03/17/21                 Plan - 02/06/21 1608    Clinical Impression Statement Pt is making progress as he reported pt felt good about the 30 min drive one way compared to L glut pain being worse when he drove this duration.   Continued to apply regional interdependent approach by minimizing thoracic hypomobile, correcting forward head. Pt demo more reciprocal gait pattern and more trunk rotation, levelled pelvis and shoulders.   Pt was educated about maintaining proper posture when watching TV to minimize thoracic kyphosis. Pt voiced understanding.   Pt continue to benefit from skilled PT. Today's session was in addition to Monday's session because more time was needed to address the increased radiating pain on RLE with shoe lift wear. Resume to 1 x week frequency in upcoming weeks.     Examination-Activity Limitations Stand;Sit    Rehab Potential Good    PT Frequency 1x / week    PT Duration Other (comment)   10   PT Treatment/Interventions ADLs/Self Care Home Management;Stair training;Therapeutic activities;Therapeutic exercise;Moist Heat;Neuromuscular re-education;Balance training;Scar mobilization;Manual techniques;Functional mobility training;Traction;Cryotherapy;Dry needling;Patient/family education;Compression bandaging;Taping;Splinting;Gait training;Energy conservation;Spinal Manipulations;Joint Manipulations    Consulted and Agree with Plan of Care Patient           Patient will benefit from skilled therapeutic intervention in order to improve the following deficits and impairments:  Hypomobility,Decreased strength,Decreased range of motion,Decreased endurance,Increased muscle spasms,Decreased scar mobility,Decreased mobility,Decreased coordination,Abnormal gait,Decreased activity tolerance,Improper body mechanics,Pain,Postural  dysfunction  Visit Diagnosis: Sacrococcygeal disorders, not elsewhere classified  Other abnormalities of gait and mobility  Unequal leg length     Problem List There are no problems to display for this patient.   Jerl Mina ,PT, DPT, E-RYT  02/07/2021, 12:18 PM  [place] MAIN [facility] SERVICES [address] [place], [place], [address] Phone: [phone]   Fax:  [phone]  Name: Shann Merrick MRN: [id number] Date of Birth: 02/16/1951

## 2021-02-11 ENCOUNTER — Other Ambulatory Visit: Payer: Self-pay

## 2021-02-11 ENCOUNTER — Ambulatory Visit: Payer: Medicare HMO | Attending: Sports Medicine | Admitting: Physical Therapy

## 2021-02-11 DIAGNOSIS — M533 Sacrococcygeal disorders, not elsewhere classified: Secondary | ICD-10-CM | POA: Diagnosis not present

## 2021-02-11 DIAGNOSIS — R2689 Other abnormalities of gait and mobility: Secondary | ICD-10-CM | POA: Diagnosis present

## 2021-02-11 DIAGNOSIS — M2170 Unequal limb length (acquired), unspecified site: Secondary | ICD-10-CM | POA: Insufficient documentation

## 2021-02-11 NOTE — Patient Instructions (Addendum)
Sleep with pillow between knees when sleeping on R side And also on your back       _wall stretch Clock stretch with head turns :  stand perpendicular to the wall, L side to the wall Tilt head to wall  Place L palm at 7 o clock Chin tuck like you are looking into armpit Look at "[place] on giant map  Swivel head with chin tucked to look in upper corner of ceiling as if you are look at  [place] on giant map "   5 reps   Switch direction, R palm on wall at 5 o 'clock   Chin tuck like you are looking into armpit Look at "[place]  on giant map  Swivel head with chin tucked to look in upper corner of ceiling as if you are look at  [facility] on giant map "   5 reps    ___  Do the winging and open book exercise on both sides

## 2021-02-11 NOTE — Therapy (Signed)
[place] MAIN [facility] SERVICES [address] [place], [place], [address] Phone: [phone]   Fax:  [phone]  Physical Therapy Treatment  Patient Details  Name: Alejandro Williamson MRN: [id number] Date of Birth: 08-Aug-1951 Referring Provider (PT): Candelaria Stagers MD   Encounter Date: 02/11/2021   PT End of Session - 02/11/21 1711    Visit Number 4    Number of Visits 10   eval 01/28/21   Date for PT Re-Evaluation 04/08/21    PT Start Time 6378    PT Stop Time 1800    PT Time Calculation (min) 55 min    Activity Tolerance Patient tolerated treatment well    Behavior During Therapy [facility] for tasks assessed/performed           Past Medical History:  Diagnosis Date  . Basal cell carcinoma 04/09/2009, 11/18/2009   right med forehead 3cm sup to right medial brow    No past surgical history on file.  There were no vitals filed for this visit.   Subjective Assessment - 02/11/21 1709    Subjective Pt felt sore in the L hip and glut after last session but it got better after 2 days. L ribcage is sore from lying on belly with one the exercises. Pt feels his discomfort with sitting is back to baseline or better. Pt found the new ways to lie on his pillows when watching TV feels better    Patient Stated Goals be able to drive to [facility] and see grandkids              [facility] PT Assessment - 02/11/21 1718      AROM   Overall AROM Comments cervical rotation R 65 deg, L 70 deg      Palpation   Palpation comment tightness at teres minor/ pect/ medial scap L      Ambulation/Gait   Gait Comments reciporcal gait                         [facility] Adult PT Treatment/Exercise - 02/11/21 1718      Therapeutic Activites    Other Therapeutic Activities explained positions for sleep and maintaining alignment of hips and knees and neck      Neuro Re-ed    Neuro Re-ed Details  cued for neck stretches      Manual Therapy   Manual therapy comments  STM/MWM PA mob at thoracic spine/ medial scapula mm L   L                      PT Long Term Goals - 02/03/21 0910      PT LONG TERM GOAL #1   Title Pt will be able to sit for 30 min on a hard surface in order to prepare to return to church and sit through a whole service    Baseline no able to sit 30 min    Time 8    Period Weeks    Status On-going      PT LONG TERM GOAL #2   Title Pt will report no  numbness along  B thighs above knee after standing for long periods 30-40 min in order to perform ADLs    Baseline numbness along  B thighs above knee after standing for long periods 30-40 min    Time 8    Period Weeks    Status On-going      PT LONG  TERM GOAL #3   Title Pt will be IND with car seat modifications in order to ride in variable car seat s in order to engage in voluteer activities and support wife who depends on him for driving    Baseline not IND    Time 4    Period Weeks    Status On-going      PT LONG TERM GOAL #4   Title Pt will decrease his FOTO pain score from 50 pts to < 25 pts in order to improve QOL    Baseline 50pts    Time 10    Period Weeks    Status On-going      PT LONG TERM GOAL #5   Title Pt will demo equal pelvic girdle height and less spinal deviations, and more reciprocal gait pattern    Baseline L illiac crest higher,  limited R anterior pelvic rotation with swing phase    Time 2    Period Weeks    Status On-going      Additional Long Term Goals   Additional Long Term Goals Yes      PT LONG TERM GOAL #6   Title Pt will demo increased strength on RLE from 3+/5 to > 4/5 hip flexion, hip abd, hip ext, and increased hip ext from 10 deg to > 20 deg ( L 20 deg) ( tested in sidelying) in order to walk 10K steps with wife    Baseline 3+/5 hip flexion, hip abd, hip ext    Time 6    Period Weeks    Status On-going      PT LONG TERM GOAL #7   Title Flare ups with pain decrease from   3-4 x a week  to < 2-3 x week    Time 6    Period  Weeks    Status New    Target Date 03/17/21                 Plan - 02/11/21 1811    Clinical Impression Statement Pt is making progress as pt is noticing his discomfort with sitting is at baseline or better. Continued to use regional interdependent approach to address limited mobility cervical / thoracic spine. Manual Tx addressed remaining tightness at L upper quadrant and after Tx, pt demo'd increased cervical rotation. Pt has been compliant with how to position his posture when watching TV. Educated pt on sleeping postures to minimize tightness on L upper quadrant due to his preferred position of L sideyling without pillow which can contribut to deviations to his spine and hip.   Pt continues to benefit from skilled PT    Examination-Activity Limitations Stand;Sit    Rehab Potential Good    PT Frequency 1x / week    PT Duration Other (comment)   10   PT Treatment/Interventions ADLs/Self Care Home Management;Stair training;Therapeutic activities;Therapeutic exercise;Moist Heat;Neuromuscular re-education;Balance training;Scar mobilization;Manual techniques;Functional mobility training;Traction;Cryotherapy;Dry needling;Patient/family education;Compression bandaging;Taping;Splinting;Gait training;Energy conservation;Spinal Manipulations;Joint Manipulations    Consulted and Agree with Plan of Care Patient           Patient will benefit from skilled therapeutic intervention in order to improve the following deficits and impairments:  Hypomobility,Decreased strength,Decreased range of motion,Decreased endurance,Increased muscle spasms,Decreased scar mobility,Decreased mobility,Decreased coordination,Abnormal gait,Decreased activity tolerance,Improper body mechanics,Pain,Postural dysfunction  Visit Diagnosis: Sacrococcygeal disorders, not elsewhere classified  Other abnormalities of gait and mobility  Unequal leg length     Problem List There are no problems to display for this  patient.  Jerl Mina ,PT, DPT, E-RYT  02/11/2021, 6:13 PM  [place] MAIN [facility] SERVICES [address] [place], [place], [address] Phone: [phone]   Fax:  [phone]  Name: Alejandro Williamson MRN: [id number] Date of Birth: 1951-07-21

## 2021-02-20 ENCOUNTER — Other Ambulatory Visit: Payer: Self-pay

## 2021-02-20 ENCOUNTER — Ambulatory Visit: Payer: Medicare HMO | Admitting: Physical Therapy

## 2021-02-20 DIAGNOSIS — M533 Sacrococcygeal disorders, not elsewhere classified: Secondary | ICD-10-CM

## 2021-02-20 DIAGNOSIS — M2170 Unequal limb length (acquired), unspecified site: Secondary | ICD-10-CM

## 2021-02-20 DIAGNOSIS — R2689 Other abnormalities of gait and mobility: Secondary | ICD-10-CM

## 2021-02-20 NOTE — Patient Instructions (Signed)
Locust pose  Pillow under hips  Palms face midline by hips  Finger tips shooting straight down  Imagine holding pencil under your armpits Draw shoulders away from ears Inhale Exhale lift chest up slightly without feeling it in your back. The bend happens in the midback  (keep chin tucked)    ___  Seated locust post but hands are pressing into the chair, shins perpendicular to the spine Exhale pressing down  10 sets   ___  Wall squats with elbow and shoulders back of head pressing to wall  Inhale Exhale stand up  shins perpendicular to the spine  20 reps   ___  ASk wife to take a pic of your TV couch set up and rocking chair while reading ipad

## 2021-02-20 NOTE — Therapy (Signed)
[place] MAIN [facility] SERVICES [address] [place], [place], [address] Phone: [phone]   Fax:  [phone]  Physical Therapy Treatment  Patient Details  Name: Alejandro Williamson MRN: [id number] Date of Birth: 1951/11/04 Referring Provider (PT): Candelaria Stagers MD   Encounter Date: 02/20/2021   PT End of Session - 02/20/21 0500    Visit Number 5    Number of Visits 10   eval 01/28/21   Date for PT Re-Evaluation 04/08/21    PT Start Time 0800    PT Stop Time 0915    PT Time Calculation (min) 75 min    Activity Tolerance Patient tolerated treatment well    Behavior During Therapy [facility] for tasks assessed/performed           Past Medical History:  Diagnosis Date  . Basal cell carcinoma 04/09/2009, 11/18/2009   right med forehead 3cm sup to right medial brow    No past surgical history on file.  There were no vitals filed for this visit.   Subjective Assessment - 02/20/21 0807    Subjective Pt felt really good after last session for  3 days and pt was able to sitting longer up to hours. Pt push a wheel barrow with 125 lb of birdseeds up a hill. After this activity, he experienced a flare up at te L glut that radiated down the posterior thigh.The flareup eased up after 5 days.    Patient Stated Goals be able to drive to [facility] and see grandkids              [facility] PT Assessment - 02/20/21 0813      Observation/Other Assessments   Observations difficulty with low cobra due to hypomobility at T spine    Scoliosis L convex curve at L1, R convex T10      AROM   Overall AROM Comments R cervical rotation: 45 deg R, L 35 deg  seated ( post Tx: 55 deg R, 50 deg L ) , supine L 55 deg, R 45 deg ( post Tx: 55 deg B)      Palpation   Palpation comment tightness at suprasupinatus L, hypomobile T7-10, interspinal mm tightness , medial scapular mm      Ambulation/Gait   Gait Comments reciporcal gait pattern                          [facility] Adult PT Treatment/Exercise - 02/20/21 0813      Therapeutic Activites    Other Therapeutic Activities discussed seated positions with more cervical propioception      Neuro Re-ed    Neuro Re-ed Details  cued for cervical retraction and new HEP      Manual Therapy   Manual therapy comments STM/MWM PA mob at thoracic and mm noted in assessment                       PT Long Term Goals - 02/03/21 0910      PT LONG TERM GOAL #1   Title Pt will be able to sit for 30 min on a hard surface in order to prepare to return to church and sit through a whole service    Baseline no able to sit 30 min    Time 8    Period Weeks    Status On-going      PT LONG TERM GOAL #2   Title Pt will report no  numbness along  B thighs above knee after standing for long periods 30-40 min in order to perform ADLs    Baseline numbness along  B thighs above knee after standing for long periods 30-40 min    Time 8    Period Weeks    Status On-going      PT LONG TERM GOAL #3   Title Pt will be IND with car seat modifications in order to ride in variable car seat s in order to engage in voluteer activities and support wife who depends on him for driving    Baseline not IND    Time 4    Period Weeks    Status On-going      PT LONG TERM GOAL #4   Title Pt will decrease his FOTO pain score from 50 pts to < 25 pts in order to improve QOL    Baseline 50pts    Time 10    Period Weeks    Status On-going      PT LONG TERM GOAL #5   Title Pt will demo equal pelvic girdle height and less spinal deviations, and more reciprocal gait pattern    Baseline L illiac crest higher,  limited R anterior pelvic rotation with swing phase    Time 2    Period Weeks    Status On-going      Additional Long Term Goals   Additional Long Term Goals Yes      PT LONG TERM GOAL #6   Title Pt will demo increased strength on RLE from 3+/5 to > 4/5 hip flexion, hip abd, hip ext, and increased hip ext from 10 deg to  > 20 deg ( L 20 deg) ( tested in sidelying) in order to walk 10K steps with wife    Baseline 3+/5 hip flexion, hip abd, hip ext    Time 6    Period Weeks    Status On-going      PT LONG TERM GOAL #7   Title Flare ups with pain decrease from   3-4 x a week  to < 2-3 x week    Time 6    Period Weeks    Status New    Target Date 03/17/21                 Plan - 02/20/21 0933    Clinical Impression Statement Pt experienced less pain and was able to sit for 1 hour across 4 days after last session until he pushed a wheel barrow with 125 lb of bird seed uphill. Pt demo'd slightly lowered R iliac crest alignment, thoracolumbar scoliotic curve.   Continued to work on upper spine which improved cervical mobility. Heavy emphasis on sitting posture and cervicoscapular propioception. Withheld low cobra from HEP due to tendency to be in lumbar lordosis. Added cervico/scapular strengthening to HEP.  Pt required excessive cues for propioception. Pt was educated about chunking his activities to minimize from overdoing such as what happened with pushing wheel barrow with 125 lb of birdseeds uphill.  Pt continues to benefit skilled PT     Examination-Activity Limitations Stand;Sit    Rehab Potential Good    PT Frequency 1x / week    PT Duration Other (comment)   10   PT Treatment/Interventions ADLs/Self Care Home Management;Stair training;Therapeutic activities;Therapeutic exercise;Moist Heat;Neuromuscular re-education;Balance training;Scar mobilization;Manual techniques;Functional mobility training;Traction;Cryotherapy;Dry needling;Patient/family education;Compression bandaging;Taping;Splinting;Gait training;Energy conservation;Spinal Manipulations;Joint Manipulations    Consulted and Agree with Plan of Care Patient  Patient will benefit from skilled therapeutic intervention in order to improve the following deficits and impairments:  Hypomobility,Decreased strength,Decreased range of  motion,Decreased endurance,Increased muscle spasms,Decreased scar mobility,Decreased mobility,Decreased coordination,Abnormal gait,Decreased activity tolerance,Improper body mechanics,Pain,Postural dysfunction  Visit Diagnosis: Sacrococcygeal disorders, not elsewhere classified  Other abnormalities of gait and mobility  Unequal leg length     Problem List There are no problems to display for this patient.   Jerl Mina ,PT, DPT, E-RYT  02/20/2021, 12:42 PM  [place] MAIN [facility] SERVICES [address] [place], [place], [address] Phone: [phone]   Fax:  [phone]  Name: Alejandro Williamson MRN: [id number] Date of Birth: 1951-01-25

## 2021-02-25 ENCOUNTER — Ambulatory Visit: Payer: Medicare HMO | Admitting: Physical Therapy

## 2021-02-25 ENCOUNTER — Other Ambulatory Visit: Payer: Self-pay

## 2021-02-25 DIAGNOSIS — R2689 Other abnormalities of gait and mobility: Secondary | ICD-10-CM

## 2021-02-25 DIAGNOSIS — M533 Sacrococcygeal disorders, not elsewhere classified: Secondary | ICD-10-CM | POA: Diagnosis not present

## 2021-02-25 DIAGNOSIS — M2170 Unequal limb length (acquired), unspecified site: Secondary | ICD-10-CM

## 2021-02-25 NOTE — Therapy (Signed)
[place] MAIN [facility] SERVICES [address] [place], [place], [address] Phone: [phone]   Fax:  [phone]  Physical Therapy Treatment  Patient Details  Name: Alejandro Williamson MRN: [id number] Date of Birth: 12/14/1951 Referring Provider (PT): Candelaria Stagers MD   Encounter Date: 02/25/2021   PT End of Session - 02/25/21 1411    Visit Number 6    Number of Visits 10   eval 01/28/21   Date for PT Re-Evaluation 04/08/21    PT Start Time 1604    PT Stop Time 1700    PT Time Calculation (min) 56 min    Activity Tolerance Patient tolerated treatment well    Behavior During Therapy [facility] for tasks assessed/performed           Past Medical History:  Diagnosis Date  . Basal cell carcinoma 04/09/2009, 11/18/2009   right med forehead 3cm sup to right medial brow    No past surgical history on file.  There were no vitals filed for this visit.   Subjective Assessment - 02/25/21 1411    Subjective Pt noticed the rotating to the opposite side of his couch  and switching to sitting a chair is helping. Sleeping with a pillow between knees is more comfortable. Pt noticed the pillow slipped out in the middle of the night and the L glut area was hurting . Pt placed it between his knees again and it was not hurting anymore. Pt is training himself to sit in hard chair for 30 min - 45 min    Patient Stated Goals be able to drive to [facility] and see grandkids              [facility] PT Assessment - 02/25/21 1414      Observation/Other Assessments   Observations less dowagers hump, forward head      Palpation   Spinal mobility thoracic convex curve at T 10 R and T/L junction      Special Tests   Other special tests functional reach in prone: R thumb at T12, L thumb at T10                         [facility] Adult PT Treatment/Exercise - 02/25/21 1453      Therapeutic Activites    Other Therapeutic Activities assessed pictures of home set up with  counch and chairs      Manual Therapy   Manual therapy comments PA mob Grade III, STM/MWM at interspinals, intercostals, mob Grade III  T 10 R and T/L junction                       PT Long Term Goals - 02/03/21 0910      PT LONG TERM GOAL #1   Title Pt will be able to sit for 30 min on a hard surface in order to prepare to return to church and sit through a whole service    Baseline no able to sit 30 min    Time 8    Period Weeks    Status On-going      PT LONG TERM GOAL #2   Title Pt will report no  numbness along  B thighs above knee after standing for long periods 30-40 min in order to perform ADLs    Baseline numbness along  B thighs above knee after standing for long periods 30-40 min    Time 8  Period Weeks    Status On-going      PT LONG TERM GOAL #3   Title Pt will be IND with car seat modifications in order to ride in variable car seat s in order to engage in voluteer activities and support wife who depends on him for driving    Baseline not IND    Time 4    Period Weeks    Status On-going      PT LONG TERM GOAL #4   Title Pt will decrease his FOTO pain score from 50 pts to < 25 pts in order to improve QOL    Baseline 50pts    Time 10    Period Weeks    Status On-going      PT LONG TERM GOAL #5   Title Pt will demo equal pelvic girdle height and less spinal deviations, and more reciprocal gait pattern    Baseline L illiac crest higher,  limited R anterior pelvic rotation with swing phase    Time 2    Period Weeks    Status On-going      Additional Long Term Goals   Additional Long Term Goals Yes      PT LONG TERM GOAL #6   Title Pt will demo increased strength on RLE from 3+/5 to > 4/5 hip flexion, hip abd, hip ext, and increased hip ext from 10 deg to > 20 deg ( L 20 deg) ( tested in sidelying) in order to walk 10K steps with wife    Baseline 3+/5 hip flexion, hip abd, hip ext    Time 6    Period Weeks    Status On-going      PT LONG  TERM GOAL #7   Title Flare ups with pain decrease from   3-4 x a week  to < 2-3 x week    Time 6    Period Weeks    Status New    Target Date 03/17/21                 Plan - 02/25/21 1411    Clinical Impression Statement Pt is making progress with ability to sit for 30-45 min in chair. Pt's pelvic girdler remains in alignment but manual Tx is still need to correct convex curves at thoracic and T/L junction.  Pt continues to benefit from skilled PT    Examination-Activity Limitations Stand;Sit    Rehab Potential Good    PT Frequency 1x / week    PT Duration Other (comment)   10   PT Treatment/Interventions ADLs/Self Care Home Management;Stair training;Therapeutic activities;Therapeutic exercise;Moist Heat;Neuromuscular re-education;Balance training;Scar mobilization;Manual techniques;Functional mobility training;Traction;Cryotherapy;Dry needling;Patient/family education;Compression bandaging;Taping;Splinting;Gait training;Energy conservation;Spinal Manipulations;Joint Manipulations    Consulted and Agree with Plan of Care Patient           Patient will benefit from skilled therapeutic intervention in order to improve the following deficits and impairments:  Hypomobility,Decreased strength,Decreased range of motion,Decreased endurance,Increased muscle spasms,Decreased scar mobility,Decreased mobility,Decreased coordination,Abnormal gait,Decreased activity tolerance,Improper body mechanics,Pain,Postural dysfunction  Visit Diagnosis: Sacrococcygeal disorders, not elsewhere classified  Other abnormalities of gait and mobility  Unequal leg length     Problem List There are no problems to display for this patient.   Jerl Mina ,PT, DPT, E-RYT  02/25/2021, 3:02 PM  [place] MAIN [facility] SERVICES [address] [place], [place], [address] Phone: [phone]   Fax:  [phone]  Name: Alejandro Williamson MRN: [id number] Date of Birth:  1951/10/12

## 2021-03-07 ENCOUNTER — Ambulatory Visit: Payer: Medicare HMO | Admitting: Physical Therapy

## 2021-03-07 ENCOUNTER — Other Ambulatory Visit: Payer: Self-pay

## 2021-03-07 DIAGNOSIS — R2689 Other abnormalities of gait and mobility: Secondary | ICD-10-CM

## 2021-03-07 DIAGNOSIS — M533 Sacrococcygeal disorders, not elsewhere classified: Secondary | ICD-10-CM | POA: Diagnosis not present

## 2021-03-07 DIAGNOSIS — M2170 Unequal limb length (acquired), unspecified site: Secondary | ICD-10-CM

## 2021-03-07 NOTE — Patient Instructions (Signed)
Backward lunges With knee against chair to learn knee above ankle  Back foot back with ballmounds pressing ground  2 mins__    Stretch for pelvic floor   V- slides  "v heels slide away and then back toward buttocks and then rock knee to slight ,  slide heel along at 11 o clock away from buttocks   10 reps

## 2021-03-07 NOTE — Therapy (Signed)
[place] MAIN [facility] SERVICES [address] [place], [place], [address] Phone: [phone]   Fax:  [phone]  Physical Therapy Treatment  Patient Details  Name: Alejandro Williamson MRN: [id number] Date of Birth: [birth year] Referring Provider (PT): Candelaria Stagers MD   Encounter Date: 03/07/2021   PT End of Session - 03/07/21 0953    Visit Number 7    Number of Visits 10   eval 01/28/21   Date for PT Re-Evaluation 04/08/21    PT Start Time 0904    PT Stop Time 1000    PT Time Calculation (min) 56 min    Activity Tolerance Patient tolerated treatment well    Behavior During Therapy [facility] for tasks assessed/performed           Past Medical History:  Diagnosis Date  . Basal cell carcinoma 04/09/2009, 11/18/2009   right med forehead 3cm sup to right medial brow    No past surgical history on file.  There were no vitals filed for this visit.   Subjective Assessment - 03/07/21 0909    Subjective Pt noticed the rotating to the opposite side of his couch  and switching to sitting a chair is helping. Sleeping with a pillow between knees is more comfortable. Pt noticed the pillow slipped out in the middle of the night and the L glut area was hurting . Pt placed it between his knees again and it was not hurting anymore. Pt is training himself to sit in hard chair for 30 min - 45 min    Patient Stated Goals be able to drive to [facility] and see grandkids              [facility] PT Assessment - 03/07/21 1000      Observation/Other Assessments   Observations poor alignment narrow BOS, backward lunges   scaption with upper trap overuse so withheld     Palpation   Palpation comment cervical/ thoracic segments C4-7, T 3-10 deviated/ hypomobile, interspinals, anterior neck mm tightness  Tightness at posterior pelvic floor mm                         [facility] Adult PT Treatment/Exercise - 03/07/21 0954      Neuro Re-ed    Neuro Re-ed Details   excessive cues for knee alignment, feet placement in back ward lunges      Manual Therapy   Manual therapy comments distraction at cervical/ thoracic segments C4-7, T 3-10 deviated/ hypomobile, interspinals, anterior neck mm tightness   STM/MWM at L posterior pelvic floor mm                      PT Long Term Goals - 02/25/21 1502      PT LONG TERM GOAL #1   Title Pt will be able to sit for 30 min on a hard surface in order to prepare to return to church and sit through a whole service    Baseline no able to sit 30 min  ( 3/15: able to sit 30- 45 min )    Time 8    Period Weeks    Status Achieved      PT LONG TERM GOAL #2   Title Pt will report no  numbness along  B thighs above knee after standing for long periods 30-40 min in order to perform ADLs    Baseline numbness along  B thighs above knee after standing for  long periods 30-40 min    Time 8    Period Weeks    Status On-going      PT LONG TERM GOAL #3   Title Pt will be IND with car seat modifications in order to ride in variable car seat s in order to engage in voluteer activities and support wife who depends on him for driving    Baseline not IND    Time 4    Period Weeks    Status Achieved      PT LONG TERM GOAL #4   Title Pt will decrease his FOTO pain score from 50 pts to < 25 pts in order to improve QOL    Baseline 50pts    Time 10    Period Weeks    Status On-going      PT LONG TERM GOAL #5   Title Pt will demo equal pelvic girdle height and less spinal deviations, and more reciprocal gait pattern    Baseline L illiac crest higher,  limited R anterior pelvic rotation with swing phase    Time 2    Period Weeks    Status Achieved      PT LONG TERM GOAL #6   Title Pt will demo increased strength on RLE from 3+/5 to > 4/5 hip flexion, hip abd, hip ext, and increased hip ext from 10 deg to > 20 deg ( L 20 deg) ( tested in sidelying) in order to walk 10K steps with wife    Baseline 3+/5 hip flexion,  hip abd, hip ext    Time 6    Period Weeks    Status On-going      PT LONG TERM GOAL #7   Title Flare ups with pain decrease from   3-4 x a week  to < 2-3 x week    Time 6    Period Weeks    Status On-going                 Plan - 03/07/21 0953    Clinical Impression Statement Pt continues to notice positive changes to his glut pain and was able to drive to [facility] for 40 min one way with minimal pain. Pt is compliant with his modifications with positions when watching TV.   Progressed with further manual Tx to minimize hypomobility in cervical/ thoracic spine. Pt demo'd more upright posture and less forward head. Provided manual Tx to minimize pelvic floor tightness.   Advnaced to functional glut strengthening and pt required excessive cues for propioception. Pt continues to benefit from skilled PT.    Examination-Activity Limitations Stand;Sit    Rehab Potential Good    PT Frequency 1x / week    PT Duration Other (comment)   10   PT Treatment/Interventions ADLs/Self Care Home Management;Stair training;Therapeutic activities;Therapeutic exercise;Moist Heat;Neuromuscular re-education;Balance training;Scar mobilization;Manual techniques;Functional mobility training;Traction;Cryotherapy;Dry needling;Patient/family education;Compression bandaging;Taping;Splinting;Gait training;Energy conservation;Spinal Manipulations;Joint Manipulations    Consulted and Agree with Plan of Care Patient           Patient will benefit from skilled therapeutic intervention in order to improve the following deficits and impairments:  Hypomobility,Decreased strength,Decreased range of motion,Decreased endurance,Increased muscle spasms,Decreased scar mobility,Decreased mobility,Decreased coordination,Abnormal gait,Decreased activity tolerance,Improper body mechanics,Pain,Postural dysfunction  Visit Diagnosis: Sacrococcygeal disorders, not elsewhere classified  Other abnormalities of gait and  mobility  Unequal leg length     Problem List There are no problems to display for this patient.   Jerl Mina ,PT, DPT, E-RYT  03/07/2021, 10:04  AM  [place] MAIN [facility] SERVICES [address] [place], [place], [address] Phone: [phone]   Fax:  [phone]  Name: Alejandro Williamson MRN: [id number] Date of Birth: 15-Mar-1951

## 2021-03-14 ENCOUNTER — Ambulatory Visit: Payer: Medicare HMO | Attending: Sports Medicine | Admitting: Physical Therapy

## 2021-03-14 ENCOUNTER — Other Ambulatory Visit: Payer: Self-pay

## 2021-03-14 DIAGNOSIS — M2170 Unequal limb length (acquired), unspecified site: Secondary | ICD-10-CM | POA: Diagnosis present

## 2021-03-14 DIAGNOSIS — M533 Sacrococcygeal disorders, not elsewhere classified: Secondary | ICD-10-CM | POA: Insufficient documentation

## 2021-03-14 DIAGNOSIS — R2689 Other abnormalities of gait and mobility: Secondary | ICD-10-CM | POA: Insufficient documentation

## 2021-03-14 NOTE — Patient Instructions (Signed)
   Feet slides :   Points of contact at sitting bones  Four points of contact of foot, Heel up, ankle not twist out Lower heel Four points of contact of foot, Slide foot back   Repeated with other foot    ___   Heel raises 10 reps with one hand on wall   ___  Pay attention with the backward stepping exercise :  Front knee all point through feet Pelvis over front thigh as you move back foot Place foot back at hip width apart   __  train to sit in your chair similar to church seats for 53min-60 min  Once , 3 x week

## 2021-03-14 NOTE — Therapy (Signed)
[place] MAIN [facility] SERVICES [address] [place], [place], [address] Phone: [phone]   Fax:  [phone]  Physical Therapy Treatment  Patient Details  Name: Alejandro Williamson MRN: [id number] Date of Birth: [birth year] Referring Provider (PT): Candelaria Stagers MD   Encounter Date: 03/14/2021   PT End of Session - 03/14/21 0916    Visit Number 8    Number of Visits 10   eval 01/28/21   Date for PT Re-Evaluation 04/08/21    PT Start Time 0907    PT Stop Time 1000    PT Time Calculation (min) 53 min    Activity Tolerance Patient tolerated treatment well    Behavior During Therapy [facility] for tasks assessed/performed           Past Medical History:  Diagnosis Date  . Basal cell carcinoma 04/09/2009, 11/18/2009   right med forehead 3cm sup to right medial brow    No past surgical history on file.  There were no vitals filed for this visit.   Subjective Assessment - 03/14/21 0911    Subjective Pt reported he over did the backward stepping exercise by doing 100 count instead of 2 min as instructed and noticed returned of L glut pain. After stopping this exercise, the pain resolved.    Patient Stated Goals be able to drive to [facility] and see grandkids              [facility] PT Assessment - 03/14/21 0957      Observation/Other Assessments   Observations hallux valgus of both 1st toes, medial collapse of arches      Coordination   Coordination and Movement Description poor propioception of feet/ pelvis in backward lunges      Palpation   Palpation comment tightness between ray I-II B, adductor hallucis, hypomobile      Ambulation/Gait   Gait Comments heel striking                         [facility] Adult PT Treatment/Exercise - 03/14/21 1126      Neuro Re-ed    Neuro Re-ed Details  excessive cues for backward lunging for feet and pelvic and knee propioception      Manual Therapy   Manual therapy comments AP/PA mob, STM?MWM to  promote toe abduction, toes extension    Kinesiotex Facilitate Muscle;Create Space   lift arches                      PT Long Term Goals - 03/14/21 1007      PT LONG TERM GOAL #1   Title Pt will be able to sit for 30 min on a hard surface in order to prepare to return to church and sit through a whole service    Baseline no able to sit 30 min  ( 3/15: able to sit 30- 45 min )    Time 8    Period Weeks    Status Achieved      PT LONG TERM GOAL #2   Title Pt will report no  numbness along  B thighs above knee after standing for long periods 30-40 min in order to perform ADLs    Baseline numbness along  B thighs above knee after standing for long periods 30-40 min    Time 8    Period Weeks    Status On-going      PT LONG TERM GOAL #3  Title Pt will be IND with car seat modifications in order to ride in variable car seat s in order to engage in voluteer activities and support wife who depends on him for driving    Baseline not IND    Time 4    Period Weeks    Status Achieved      PT LONG TERM GOAL #4   Title Pt will decrease his FOTO pain score from 50 pts to < 25 pts in order to improve QOL (02/25/21: 38 pts)    Baseline 50pts    Time 10    Period Weeks    Status Partially Met      PT LONG TERM GOAL #5   Title Pt will demo equal pelvic girdle height and less spinal deviations, and more reciprocal gait pattern    Baseline L illiac crest higher,  limited R anterior pelvic rotation with swing phase    Time 2    Period Weeks    Status Achieved      Additional Long Term Goals   Additional Long Term Goals Yes      PT LONG TERM GOAL #6   Title Pt will demo increased strength on RLE from 3+/5 to > 4/5 hip flexion, hip abd, hip ext, and increased hip ext from 10 deg to > 20 deg ( L 20 deg) ( tested in sidelying) in order to walk 10K steps with wife    Baseline 3+/5 hip flexion, hip abd, hip ext    Time 6    Period Weeks    Status On-going      PT LONG TERM GOAL #7    Title Flare ups with pain decrease from   3-4 x a week  to < 2-3 x week    Time 6    Period Weeks    Status Achieved      PT LONG TERM GOAL #8   Title Pt will be able to sit on a chair at home  for 60 min in order to return to church services    Time 4    Period Weeks    Status New    Target Date 04/11/21      PT LONG TERM GOAL  #9   TITLE Pt will demo no cues for backward stepping/ lunges in order to improve propioception    Time 2    Period Weeks    Status New    Target Date 03/28/21                 Plan - 03/14/21 0916    Clinical Impression Statement Pt reported he overdid the back ward stepping exercise and felt relapse of his glut pain. Pt demo'd incorrect form and required excessive cues for propioception and wider BOS. Pt required manual Tx to address limited mobility in  B feet, toe extension, toe abduction, and collapsed arches. Plan to address lower kinetic chain at upcoming sessions as pt enjoys walking as his fitness routine. Pt's spinal deviations , pelvic mialignment, and cervical mm imbalances remain corrected as pt is compliant with HEP. Advised pt to start entraining to sit in a chair that mimics the surface of his church seats and to build endurance for up to 1 hour to prepare for returning to church services which is a meaningful task for him.   Pt continues to benefit from skilled PT   Examination-Activity Limitations Stand;Sit    Rehab Potential Good    PT Frequency 1x /   week    PT Duration Other (comment)   10   PT Treatment/Interventions ADLs/Self Care Home Management;Stair training;Therapeutic activities;Therapeutic exercise;Moist Heat;Neuromuscular re-education;Balance training;Scar mobilization;Manual techniques;Functional mobility training;Traction;Cryotherapy;Dry needling;Patient/family education;Compression bandaging;Taping;Splinting;Gait training;Energy conservation;Spinal Manipulations;Joint Manipulations    Consulted and Agree with Plan of Care  Patient           Patient will benefit from skilled therapeutic intervention in order to improve the following deficits and impairments:  Hypomobility,Decreased strength,Decreased range of motion,Decreased endurance,Increased muscle spasms,Decreased scar mobility,Decreased mobility,Decreased coordination,Abnormal gait,Decreased activity tolerance,Improper body mechanics,Pain,Postural dysfunction  Visit Diagnosis: Sacrococcygeal disorders, not elsewhere classified  Other abnormalities of gait and mobility  Unequal leg length     Problem List There are no problems to display for this patient.   Yeung,Shin Yiing ,PT, DPT, E-RYT  03/14/2021, 11:28 AM  [place] [facility] REHAB SERVICES [address] [place], [place], [address] Phone: [phone]   Fax:  [phone]  Name: Alejandro Williamson MRN: [id number] Date of Birth: 01/31/1951   

## 2021-03-20 ENCOUNTER — Other Ambulatory Visit: Payer: Self-pay

## 2021-03-20 ENCOUNTER — Ambulatory Visit: Payer: Medicare HMO | Admitting: Physical Therapy

## 2021-03-20 DIAGNOSIS — M533 Sacrococcygeal disorders, not elsewhere classified: Secondary | ICD-10-CM | POA: Diagnosis not present

## 2021-03-20 DIAGNOSIS — R2689 Other abnormalities of gait and mobility: Secondary | ICD-10-CM

## 2021-03-20 NOTE — Patient Instructions (Signed)
Applying Pelvic tilts:  Finding a comfortable position when laying on your back  Laying on your back, lift hips up, then scoot tail under, lowering ribs / midback first, then the low back Pillow under knees      Pelvic tilts Forward, Back, and Neutral   STANDING ( neutral is where you want to practice finding more and more often. More weight across the ball mound of feet and heels not only the heels, and not locking the knees.   SITTING: feet under knees, hip width apart. Weigh on the sitting bones. Thumb on the back iliac crest, Index finger at the front of the hip. Rock through 3 positions to find neutral, press in the feet and sense the sitting bones ( ischial tuberosity) in contact to the seat.   Posterior tilt ( thumb is lower)  Anterior tilt ( index finger is lower).   Neutral ( thumb and index finger is levelled)    Sitting at work chair that may have a dip in the seat Place folded towel/ blanket placed towards the back of the seat , sitting on sitting bones , don't lean to the back of the chair   ___  Stretch for pelvic floor   V- slides  "v heels slide away and then back toward buttocks and then rock knee to slight ,  slide heel along at 11 o clock away from buttocks   10 reps      On belly: Riding horse edge of mattress  knee bent like riding a horse, move knee towards armpit and out  10 reps   Mermaid stretch  Rocking while seated on the floor with heels to one side of the hip Heels to one side of the hip  Rock forward towards the knee that is bent , rock beck towards the opposite sitting bones

## 2021-03-21 ENCOUNTER — Encounter: Payer: Medicare HMO | Admitting: Physical Therapy

## 2021-03-21 NOTE — Therapy (Signed)
[place] MAIN [facility] SERVICES [address] [place], [place], [address] Phone: [phone]   Fax:  [phone]  Physical Therapy Treatment  Patient Details  Name: Alejandro Williamson MRN: [id number] Date of Birth: 1951-05-01 Referring Provider (PT): Candelaria Stagers MD   Encounter Date: 03/20/2021   PT End of Session - 03/20/21 1647    Visit Number 9    Number of Visits 10   eval 01/28/21   Date for PT Re-Evaluation 04/08/21    PT Start Time 1608    PT Stop Time 1701    PT Time Calculation (min) 53 min    Activity Tolerance Patient tolerated treatment well    Behavior During Therapy [facility] for tasks assessed/performed           Past Medical History:  Diagnosis Date  . Basal cell carcinoma 04/09/2009, 11/18/2009   right med forehead 3cm sup to right medial brow    No past surgical history on file.  There were no vitals filed for this visit.   Subjective Assessment - 03/20/21 1611    Subjective Pt noticed he had a flare up after sitting 35 min . It takes 2-3 days for it to subside. Today is one of the better days from last week    Patient Stated Goals be able to drive to [facility] and see grandkids                          Pelvic Floor Special Questions - 03/21/21 1217    External Perineal Exam through undergarment, tightenss at bulbospongiosus L > R, anterior / posterior mm L > R             [facility] Adult PT Treatment/Exercise - 03/21/21 1217      Neuro Re-ed    Neuro Re-ed Details  cued for pelvic tilts, stretches      Manual Therapy   Manual therapy comments STM/MWM at pelvic floor mm to increase mobility                       PT Long Term Goals - 03/14/21 1007      PT LONG TERM GOAL #1   Title Pt will be able to sit for 30 min on a hard surface in order to prepare to return to church and sit through a whole service    Baseline no able to sit 30 min  ( 3/15: able to sit 30- 45 min )    Time 8    Period  Weeks    Status Achieved      PT LONG TERM GOAL #2   Title Pt will report no  numbness along  B thighs above knee after standing for long periods 30-40 min in order to perform ADLs    Baseline numbness along  B thighs above knee after standing for long periods 30-40 min    Time 8    Period Weeks    Status On-going      PT LONG TERM GOAL #3   Title Pt will be IND with car seat modifications in order to ride in variable car seat s in order to engage in voluteer activities and support wife who depends on him for driving    Baseline not IND    Time 4    Period Weeks    Status Achieved      PT LONG TERM GOAL #4   Title Pt  will decrease his FOTO pain score from 50 pts to < 25 pts in order to improve QOL (02/25/21: 38 pts)    Baseline 50pts    Time 10    Period Weeks    Status Partially Met      PT LONG TERM GOAL #5   Title Pt will demo equal pelvic girdle height and less spinal deviations, and more reciprocal gait pattern    Baseline L illiac crest higher,  limited R anterior pelvic rotation with swing phase    Time 2    Period Weeks    Status Achieved      Additional Long Term Goals   Additional Long Term Goals Yes      PT LONG TERM GOAL #6   Title Pt will demo increased strength on RLE from 3+/5 to > 4/5 hip flexion, hip abd, hip ext, and increased hip ext from 10 deg to > 20 deg ( L 20 deg) ( tested in sidelying) in order to walk 10K steps with wife    Baseline 3+/5 hip flexion, hip abd, hip ext    Time 6    Period Weeks    Status On-going      PT LONG TERM GOAL #7   Title Flare ups with pain decrease from   3-4 x a week  to < 2-3 x week    Time 6    Period Weeks    Status Achieved      PT LONG TERM GOAL #8   Title Pt will be able to sit on a chair at home  for 60 min in order to return to church services    Time 4    Period Weeks    Status New    Target Date 04/11/21      PT LONG TERM GOAL  #9   TITLE Pt will demo no cues for backward stepping/ lunges in order to  improve propioception    Time 2    Period Weeks    Status New    Target Date 03/28/21                 Plan - 03/20/21 1647    Clinical Impression Statement Pt's upper spine has been maintained in upright position with HEP and past manual Tx which helps pt have better outcomes with L/S sacral rhythm and pelvic tilt propioception training. Focused on pelvic floor today with manuaL Tx applied to minimize tight mm. Pt was explained about the pudendal nerve and pt required excessive cues for pelvic tilts and propioception of lumbopelvic rhythm. Pt demo'd improved technique post Tx. Pt continues to benefit from skilled PT    Examination-Activity Limitations Stand;Sit    Rehab Potential Good    PT Frequency 1x / week    PT Duration Other (comment)   10   PT Treatment/Interventions ADLs/Self Care Home Management;Stair training;Therapeutic activities;Therapeutic exercise;Moist Heat;Neuromuscular re-education;Balance training;Scar mobilization;Manual techniques;Functional mobility training;Traction;Cryotherapy;Dry needling;Patient/family education;Compression bandaging;Taping;Splinting;Gait training;Energy conservation;Spinal Manipulations;Joint Manipulations    Consulted and Agree with Plan of Care Patient           Patient will benefit from skilled therapeutic intervention in order to improve the following deficits and impairments:  Hypomobility,Decreased strength,Decreased range of motion,Decreased endurance,Increased muscle spasms,Decreased scar mobility,Decreased mobility,Decreased coordination,Abnormal gait,Decreased activity tolerance,Improper body mechanics,Pain,Postural dysfunction  Visit Diagnosis: Other abnormalities of gait and mobility  Sacrococcygeal disorders, not elsewhere classified     Problem List There are no problems to display for this patient.     Yeung,Shin Yiing ,PT, DPT, E-RYT  03/21/2021, 12:17 PM  [place] [facility] MAIN REHAB  SERVICES [address] [place], [place], [address] Phone: [phone]   Fax:  [phone]  Name: Alejandro Williamson MRN: [id number] Date of Birth: 04/17/1951   

## 2021-03-27 ENCOUNTER — Other Ambulatory Visit: Payer: Self-pay

## 2021-03-27 ENCOUNTER — Ambulatory Visit: Payer: Medicare HMO | Admitting: Physical Therapy

## 2021-03-27 DIAGNOSIS — R2689 Other abnormalities of gait and mobility: Secondary | ICD-10-CM

## 2021-03-27 DIAGNOSIS — M2170 Unequal limb length (acquired), unspecified site: Secondary | ICD-10-CM

## 2021-03-27 DIAGNOSIS — M533 Sacrococcygeal disorders, not elsewhere classified: Secondary | ICD-10-CM

## 2021-03-27 NOTE — Patient Instructions (Addendum)
Use folded towel to help with anterior tilt of pelvic propioception training  In sitting   __  take a pic of profile of you in your car   __  Cardio exercises   [place] Band at waist connected to doorknob 52mins Stepping forward normal length steps, planting mid and forefoot down, center of mass ( navel) leans forward slightly as if you were walking uphill 3-4 steps till band feels taut ( MAKE SURE THE DOOR IS LOCKED AND WON'T OPEN)   Stepping backwards, lower heel slowly, carry trunk and hips back , leaning forward, front knee along 2-3 rd toe line    2 min  ____  Standing marches 30 sec Rest 30 sec   3 -5 reps    ____  Pain science articles to read ( emailed)

## 2021-03-28 IMAGING — MR MR HIP*L* W/O CM
5 series · 38 of 40 positions shown · non-contrast
Comparison: MRI left hip dated August 09, 2018.

CLINICAL DATA: Chronic left hip pain.

EXAM:
MR OF THE LEFT HIP WITHOUT CONTRAST
TECHNIQUE: Multiplanar, multisequence MR imaging was performed. No intravenous
contrast was administered.

[Series 15: T1 · coronal · left · 4.0mm · 1.19mm/px · 9 of 40 slices shown]
[im 1/40]
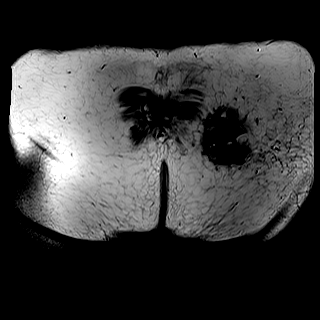
[im 5/40]
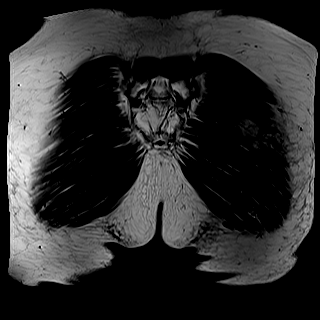
[im 10/40]
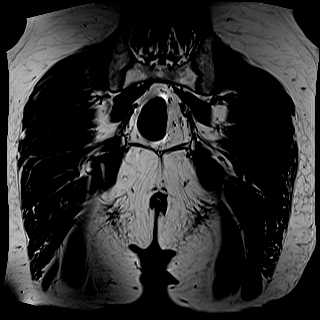
[im 15/40]
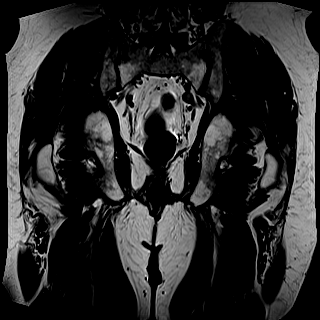
[im 20/40]
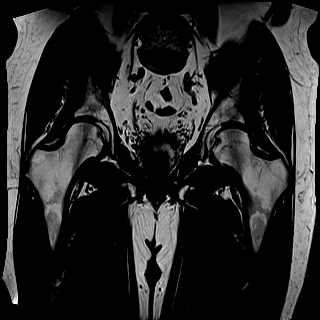
[im 25/40]
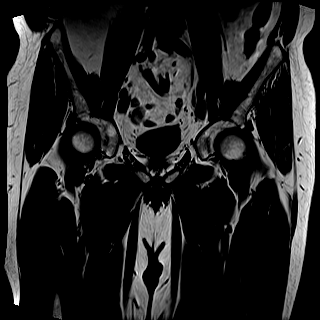
[im 30/40]
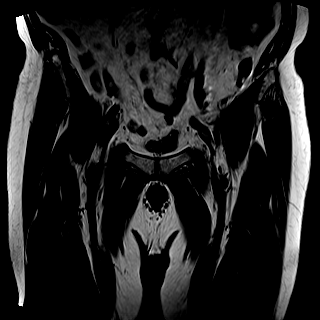
[im 35/40]
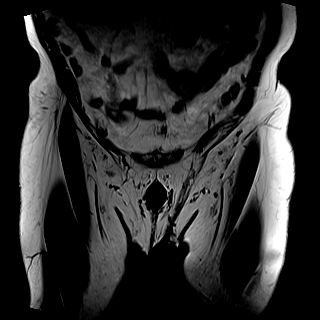
[im 40/40]
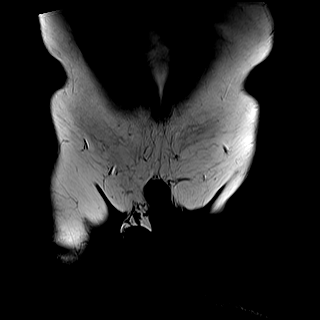

[Series 16: STIR · coronal · left · 4.0mm · 1.19mm/px · 6 of 40 slices shown]
[im 1/40]
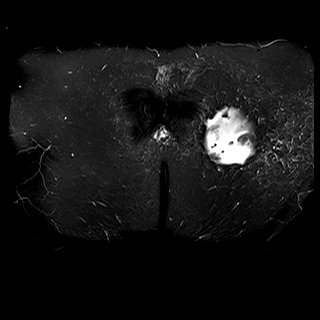
[im 6/40]
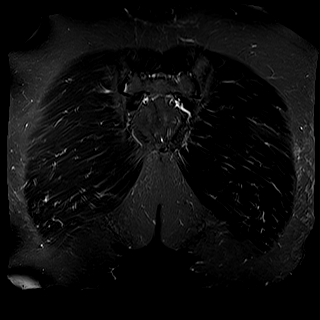
[im 12/40]
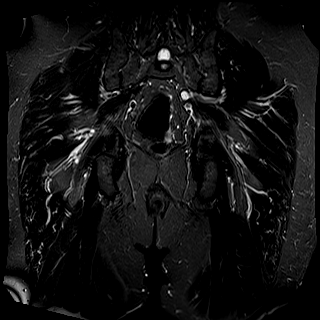
[im 17/40]
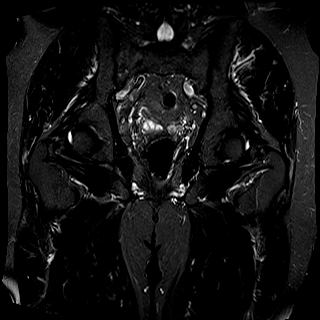
[im 23/40]
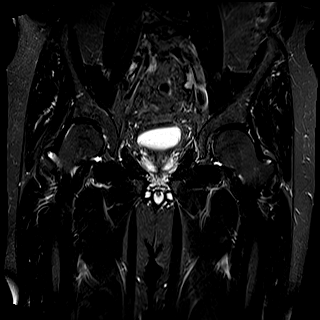
[im 28/40]
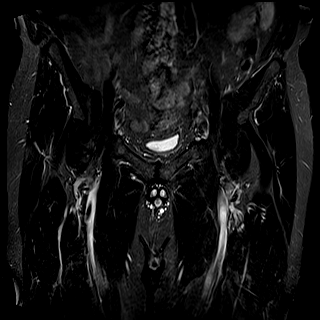

[Series 17: T2 fat-sat · axial · left · 4.0mm · 0.74mm/px · z∈[-178,+17]mm · 8 of 40 slices shown]
[im 1/40]
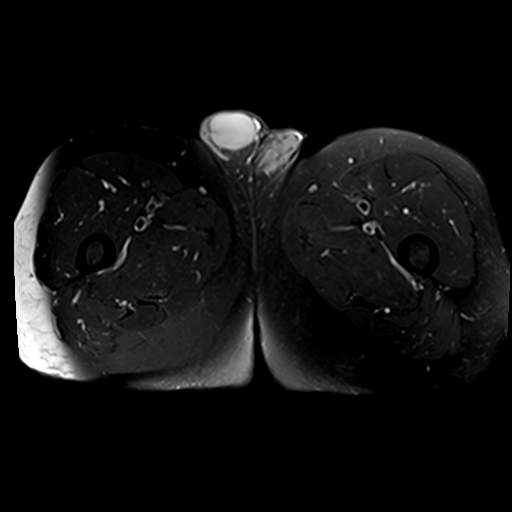
[im 6/40]
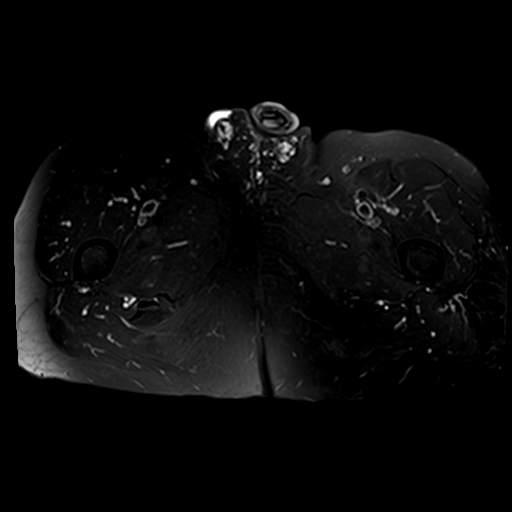
[im 12/40]
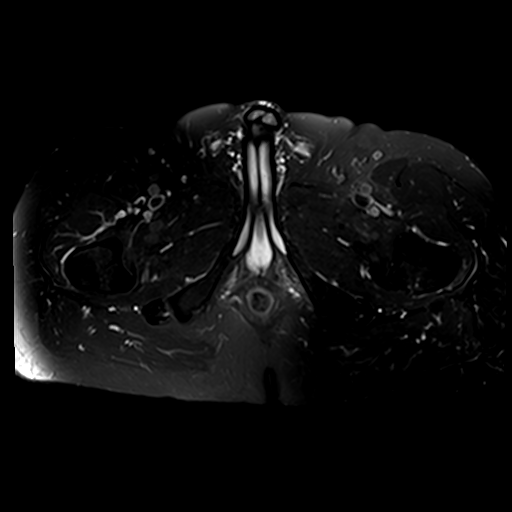
[im 17/40]
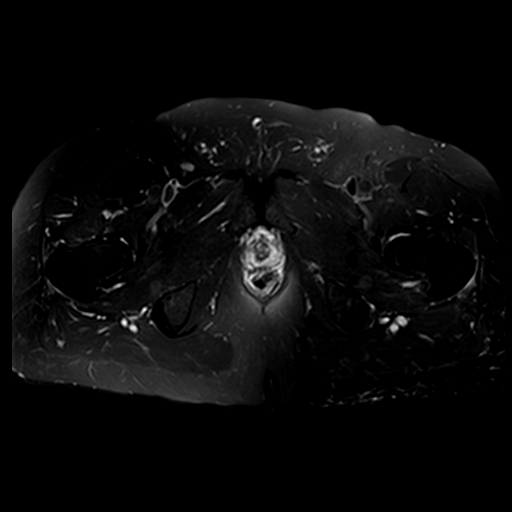
[im 23/40]
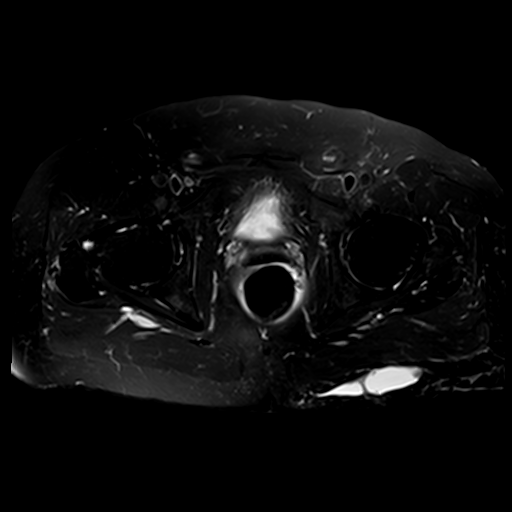
[im 28/40]
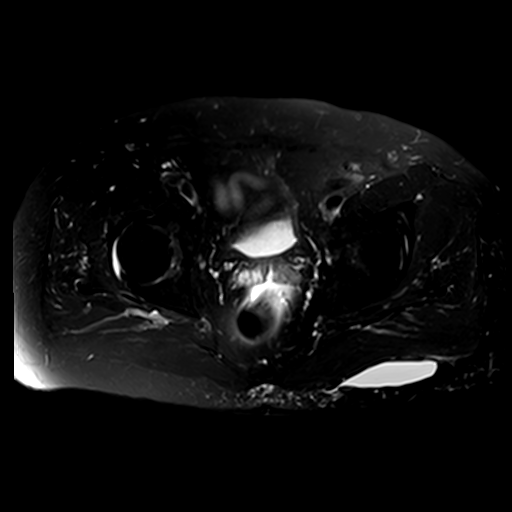
[im 34/40]
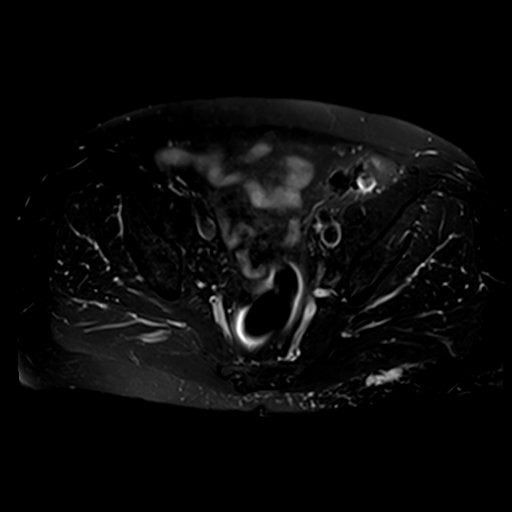
[im 40/40]
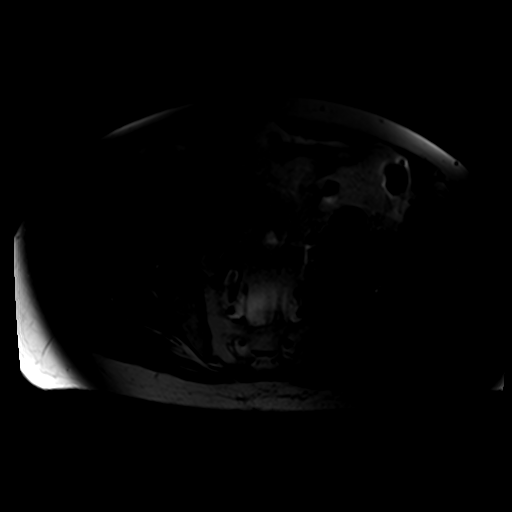

[Series 18: PD fat-sat · sagittal · left · 4.0mm · 0.94mm/px · 7 of 36 slices shown (1 of 2)]
[im 1/36]
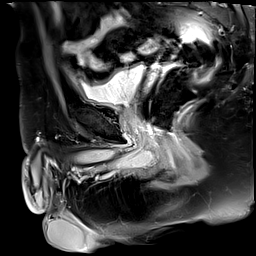
[im 6/36]
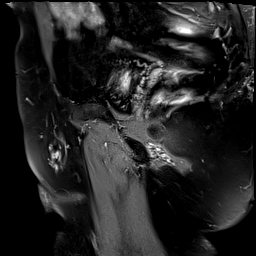
[im 12/36]
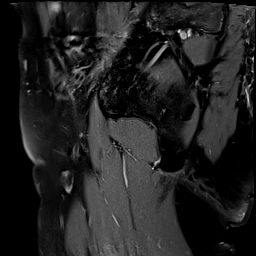
[im 18/36]
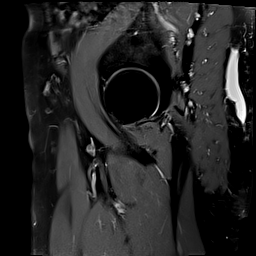
[im 24/36]
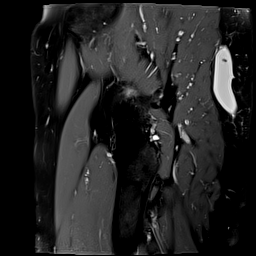
[im 30/36]
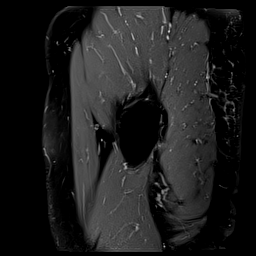
[im 36/36]
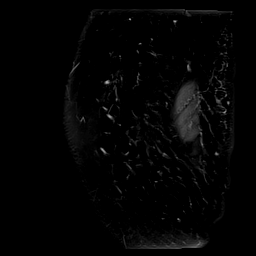

[Series 19: PD fat-sat · coronal · left · 4.0mm · 0.86mm/px · 8 of 40 slices shown (2 of 2)]
[im 1/40]
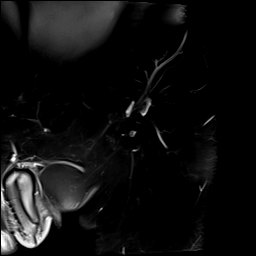
[im 6/40]
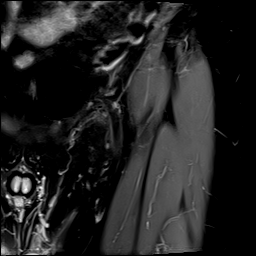
[im 12/40]
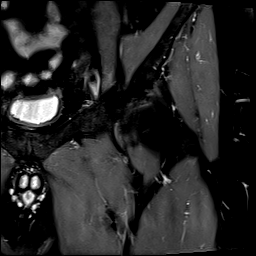
[im 17/40]
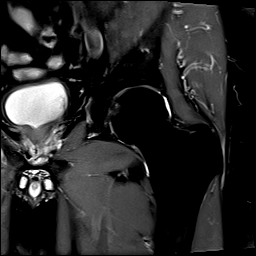
[im 23/40]
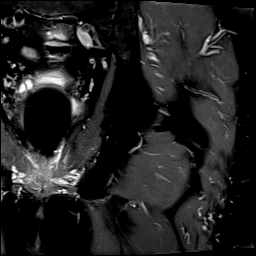
[im 28/40]
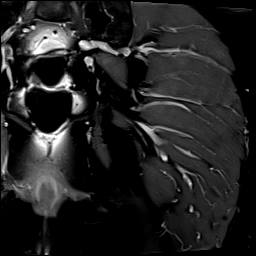
[im 34/40]
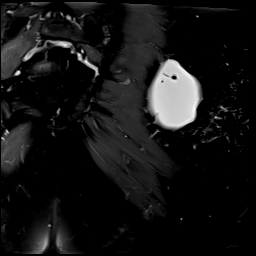
[im 40/40]
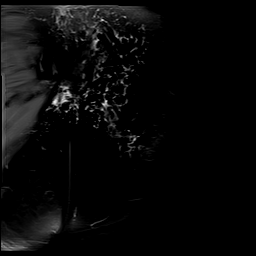

[38 of 40 positions shown; findings below may reference images not displayed]

FINDINGS: Bones: There is no evidence of acute fracture, dislocation or
avascular necrosis. No focal bone lesion. The visualized sacroiliac
joints and symphysis pubis appear normal.

Articular cartilage and labrum

Articular cartilage: No focal chondral defect or subchondral signal
abnormality identified.

Labrum: Chronic left anterior superior labral tear again identified
(series 18, images 17-18). Of the

Joint or bursal effusion

Joint effusion: No significant hip joint effusion.

Bursae: No focal periarticular fluid collection.

Muscles and tendons

Muscles and tendons: The visualized gluteus, hamstring and iliopsoas
tendons appear normal. No muscle edema or atrophy.

Other findings

Miscellaneous: Sigmoid colonic diverticulosis. The visualized
internal pelvic contents are otherwise unremarkable.

There is a new 2.0 x 8.3 x 6.6 cm encapsulated fluid collection in
the subcutaneous fat of the posterior left buttock overlying the
left gluteal fascia (series 17, image 15). There are a few tiny foci
of fat within the collection.
IMPRESSION: 1. New 2.0 x 8.3 x 6.6 cm encapsulated fluid collection in the
subcutaneous fat of the posterior left buttock overlying the left
gluteal fascia, suspicious for chronic closed degloving injury
([place] lesion).
2. Chronic left anterior superior labral tear.

## 2021-03-28 NOTE — Therapy (Signed)
[place] MAIN [facility] SERVICES [address] [place], [place], [address] Phone: [phone]   Fax:  [phone]  Physical Therapy Treatment / Progress Note reporting from 01/28/21 across 10 visits   Patient Details  Name: Alejandro Williamson MRN: [id number] Date of Birth: Sep 30, 1951 Referring Provider (PT): Candelaria Stagers MD   Encounter Date: 03/27/2021   PT End of Session - 03/28/21 1153    Visit Number 10    Number of Visits --   eval 01/28/21   Date for PT Re-Evaluation 06/06/21    PT Start Time 1100    PT Stop Time 1210    PT Time Calculation (min) 70 min    Activity Tolerance Patient tolerated treatment well    Behavior During Therapy [facility] for tasks assessed/performed           Past Medical History:  Diagnosis Date  . Basal cell carcinoma 04/09/2009, 11/18/2009   right med forehead 3cm sup to right medial brow    No past surgical history on file.  There were no vitals filed for this visit.   Subjective Assessment - 03/27/21 1111    Subjective Pt noticed pressure with sitting after last session on both sides. Mostly on the left side. These symptoms occur only when sitting 25-30 min and driving after 5 min. Today, the drive  was ok.    Patient Stated Goals be able to drive to [facility] and see grandkids              [facility] PT Assessment - 03/27/21 1203      Other:   Other/ Comments cued for anterior tilt without leaning against chair, use towel folds                         [facility] Adult PT Treatment/Exercise - 03/27/21 1204      Therapeutic Activites    Other Therapeutic Activities biopsychosocial approaches, explaiend pain science and provided articles, explained role of anterior tilt of pelvic for pudendal nn mobility and pelvic floor relaxation      Neuro Re-ed    Neuro Re-ed Details  cued for co=activation of deep core and kinetic chain                       PT Long Term Goals - 03/28/21 1154      PT  LONG TERM GOAL #1   Title Pt will be able to sit for 30 min on a hard surface in order to prepare to return to church and sit through a whole service    Baseline no able to sit 30 min  ( 3/15: able to sit 30- 45 min )    Time 8    Period Weeks    Status Achieved      PT LONG TERM GOAL #2   Title Pt will report no  numbness along  B thighs above knee after standing for long periods 30-40 min in order to perform ADLs    Baseline numbness along  B thighs above knee after standing for long periods 30-40 min    Time 8    Period Weeks    Status On-going      PT LONG TERM GOAL #3   Title Pt will be IND with car seat modifications in order to ride in variable car seat s in order to engage in voluteer activities and support wife who depends on him for driving  Baseline not IND    Time 4    Period Weeks    Status Achieved      PT LONG TERM GOAL #4   Title Pt will decrease his FOTO pain score from 50 pts to < 25 pts in order to improve QOL (02/25/21: 38 pts)    Baseline 50pts    Time 10    Period Weeks    Status Partially Met      PT LONG TERM GOAL #5   Title Pt will demo equal pelvic girdle height and less spinal deviations, and more reciprocal gait pattern    Baseline L illiac crest higher,  limited R anterior pelvic rotation with swing phase    Time 2    Period Weeks    Status Achieved      PT LONG TERM GOAL #6   Title Pt will demo increased strength on RLE from 3+/5 to > 4/5 hip flexion, hip abd, hip ext, and increased hip ext from 10 deg to > 20 deg ( L 20 deg) ( tested in sidelying) in order to walk 10K steps with wife    Baseline 3+/5 hip flexion, hip abd, hip ext    Time 6    Period Weeks    Status On-going      PT LONG TERM GOAL #7   Title Flare ups with pain decrease from   3-4 x a week  to < 2-3 x week    Time 6    Period Weeks    Status Achieved      PT LONG TERM GOAL #8   Title Pt will be able to sit on a chair at home  for 60 min in order to return to church  services    Time 4    Period Weeks    Status On-going      PT LONG TERM GOAL  #9   TITLE Pt will demo no cues for backward stepping/ lunges in order to improve propioception    Time 2    Period Weeks    Status On-going                 Plan - 03/28/21 1153    Clinical Impression Statement Pt has acheived 4/9 goals and progressing well towards remaining goals. Pt has been able to tolerate sitting up to 30 min with less pain and drive 40 min with less pain. Pt's posture has improved signficntly with less forward head, thoracic kyphosis, deviations of spine and tailbone.His pelvic floor mm tightness have decreased. Pt has made modifications to his postures when watching TV to minimize asymmetries to his spine.  Pt is learning propioception of pelvic titls to sit with more anterior tilt of pelvis and practice relaxation of pelvic floor mm. Initiated pain science education today with anatomy images and explanations of pudendal nerve provided. Plan to continue applying biopsychosocial approaches to optimize his care to further decrease his pain and discomfort with sitting.  Pt continues to benefit from skilled PT.    Examination-Activity Limitations Stand;Sit    Rehab Potential Good    PT Frequency 1x / week    PT Duration Other (comment)   10   PT Treatment/Interventions ADLs/Self Care Home Management;Stair training;Therapeutic activities;Therapeutic exercise;Moist Heat;Neuromuscular re-education;Balance training;Scar mobilization;Manual techniques;Functional mobility training;Traction;Cryotherapy;Dry needling;Patient/family education;Compression bandaging;Taping;Splinting;Gait training;Energy conservation;Spinal Manipulations;Joint Manipulations    Consulted and Agree with Plan of Care Patient           Patient will benefit from skilled  therapeutic intervention in order to improve the following deficits and impairments:  Hypomobility,Decreased strength,Decreased range of motion,Decreased  endurance,Increased muscle spasms,Decreased scar mobility,Decreased mobility,Decreased coordination,Abnormal gait,Decreased activity tolerance,Improper body mechanics,Pain,Postural dysfunction  Visit Diagnosis: Sacrococcygeal disorders, not elsewhere classified  Other abnormalities of gait and mobility  Unequal leg length     Problem List There are no problems to display for this patient.   Jerl Mina ,PT, DPT, E-RYT  03/28/2021, 12:00 PM  [place] MAIN [facility] SERVICES [address] [place], [place], [address] Phone: [phone]   Fax:  [phone]  Name: Alejandro Williamson MRN: [id number] Date of Birth: [birth year]

## 2021-04-03 ENCOUNTER — Ambulatory Visit: Payer: Medicare HMO | Admitting: Physical Therapy

## 2021-04-10 ENCOUNTER — Ambulatory Visit: Payer: Medicare HMO | Admitting: Physical Therapy

## 2021-04-10 ENCOUNTER — Other Ambulatory Visit: Payer: Self-pay

## 2021-04-10 DIAGNOSIS — M533 Sacrococcygeal disorders, not elsewhere classified: Secondary | ICD-10-CM

## 2021-04-10 DIAGNOSIS — R2689 Other abnormalities of gait and mobility: Secondary | ICD-10-CM

## 2021-04-10 DIAGNOSIS — M2170 Unequal limb length (acquired), unspecified site: Secondary | ICD-10-CM

## 2021-04-10 NOTE — Patient Instructions (Signed)
Place the band in U on the floor   Lying on back, knees bent    band under ballmounds  while laying on back w/ knees bent  "W" exercise 30 reps   Band is placed under feet, knees bent, feet are hip width apart Hold band with thumbs point out, keep upper arm and elbow touching the bed the whole time  - inhale and then exhale pull bands by bending elbows hands move in a "w"  (feel shoulder blades squeezing)   DO NOT ARCH LOW BACK

## 2021-04-10 NOTE — Therapy (Signed)
[place] MAIN [facility] SERVICES [address] [place], [place], [address] Phone: [phone]   Fax:  [phone]  Physical Therapy Treatment  Patient Details  Name: Alejandro Williamson MRN: [id number] Date of Birth: Dec 12, 1951 Referring Provider (PT): Candelaria Stagers MD   Encounter Date: 04/10/2021   PT End of Session - 04/10/21 1416    Visit Number 11    Number of Visits --   eval 01/28/21   Date for PT Re-Evaluation 06/06/21   PN 03/27/21   PT Start Time 1410    PT Stop Time 1500    PT Time Calculation (min) 50 min    Activity Tolerance Patient tolerated treatment well    Behavior During Therapy [facility] for tasks assessed/performed           Past Medical History:  Diagnosis Date  . Basal cell carcinoma 04/09/2009, 11/18/2009   right med forehead 3cm sup to right medial brow    No past surgical history on file.  There were no vitals filed for this visit.   Subjective Assessment - 04/10/21 1409    Subjective Pt read the pain science education. The glut pain is better with driving with little discomfort. Sitting is still a challenge. Pt notices his pain is better when he corrects his posture.    Patient Stated Goals be able to drive to [facility] and see grandkids                          Pelvic Floor Special Questions - 04/10/21 1538    External Perineal Exam through undergarment,  L adductor all attachments. R suprapubic area, B anterior mm to promote less tensions             [facility] Adult PT Treatment/Exercise - 04/10/21 1535      Neuro Re-ed    Neuro Re-ed Details  cued for scapulothoraci strengthening, adductor slides      Moist Heat Therapy   Number Minutes Moist Heat 5 Minutes    Moist Heat Location --   perineum through sheets and undergarments, during guided relaxation     Manual Therapy   Manual therapy comments STM/MWM L adductor all attachments. R suprapubic area, B anterior mm to promote less tensions                        PT Long Term Goals - 03/28/21 1154      PT LONG TERM GOAL #1   Title Pt will be able to sit for 30 min on a hard surface in order to prepare to return to church and sit through a whole service    Baseline no able to sit 30 min  ( 3/15: able to sit 30- 45 min )    Time 8    Period Weeks    Status Achieved      PT LONG TERM GOAL #2   Title Pt will report no  numbness along  B thighs above knee after standing for long periods 30-40 min in order to perform ADLs    Baseline numbness along  B thighs above knee after standing for long periods 30-40 min    Time 8    Period Weeks    Status On-going      PT LONG TERM GOAL #3   Title Pt will be IND with car seat modifications in order to ride in variable car seat s in order to engage  in voluteer activities and support wife who depends on him for driving    Baseline not IND    Time 4    Period Weeks    Status Achieved      PT LONG TERM GOAL #4   Title Pt will decrease his FOTO pain score from 50 pts to < 25 pts in order to improve QOL (02/25/21: 38 pts)    Baseline 50pts    Time 10    Period Weeks    Status Partially Met      PT LONG TERM GOAL #5   Title Pt will demo equal pelvic girdle height and less spinal deviations, and more reciprocal gait pattern    Baseline L illiac crest higher,  limited R anterior pelvic rotation with swing phase    Time 2    Period Weeks    Status Achieved      PT LONG TERM GOAL #6   Title Pt will demo increased strength on RLE from 3+/5 to > 4/5 hip flexion, hip abd, hip ext, and increased hip ext from 10 deg to > 20 deg ( L 20 deg) ( tested in sidelying) in order to walk 10K steps with wife    Baseline 3+/5 hip flexion, hip abd, hip ext    Time 6    Period Weeks    Status On-going      PT LONG TERM GOAL #7   Title Flare ups with pain decrease from   3-4 x a week  to < 2-3 x week    Time 6    Period Weeks    Status Achieved      PT LONG TERM GOAL #8   Title Pt will be  able to sit on a chair at home  for 60 min in order to return to church services    Time 4    Period Weeks    Status On-going      PT LONG TERM GOAL  #9   TITLE Pt will demo no cues for backward stepping/ lunges in order to improve propioception    Time 2    Period Weeks    Status On-going                 Plan - 04/10/21 1500    Clinical Impression Statement Pt demo'd less mm tensions at pelvic floor but still required more manual Tx to minimize remaining tensions at  L adductor all attachments, R suprapubic area, B anterior mm.  Advanced pt to scapulothoracic strengthening to further maintain upright posture. Pt is improving with report of less pain as he pays attention to his posture. Cued for less lumbar lordosis during HEP.   Pt continues to benefit from skilled PT.   Examination-Activity Limitations Stand;Sit    Rehab Potential Good    PT Frequency 1x / week    PT Duration Other (comment)   10   PT Treatment/Interventions ADLs/Self Care Home Management;Stair training;Therapeutic activities;Therapeutic exercise;Moist Heat;Neuromuscular re-education;Balance training;Scar mobilization;Manual techniques;Functional mobility training;Traction;Cryotherapy;Dry needling;Patient/family education;Compression bandaging;Taping;Splinting;Gait training;Energy conservation;Spinal Manipulations;Joint Manipulations    Consulted and Agree with Plan of Care Patient           Patient will benefit from skilled therapeutic intervention in order to improve the following deficits and impairments:  Hypomobility,Decreased strength,Decreased range of motion,Decreased endurance,Increased muscle spasms,Decreased scar mobility,Decreased mobility,Decreased coordination,Abnormal gait,Decreased activity tolerance,Improper body mechanics,Pain,Postural dysfunction  Visit Diagnosis: No diagnosis found.     Problem List There are no problems to display  for this patient.   Jerl Mina ,PT, DPT,  E-RYT  04/10/2021, 3:39 PM  [place] MAIN [facility] SERVICES [address] [place], [place], [address] Phone: [phone]   Fax:  [phone]  Name: Alejandro Williamson MRN: [id number] Date of Birth: 1951-11-17

## 2021-04-17 ENCOUNTER — Other Ambulatory Visit: Payer: Self-pay

## 2021-04-17 ENCOUNTER — Ambulatory Visit: Payer: Medicare HMO | Attending: Sports Medicine | Admitting: Physical Therapy

## 2021-04-17 ENCOUNTER — Ambulatory Visit: Payer: Medicare HMO

## 2021-04-17 DIAGNOSIS — M2170 Unequal limb length (acquired), unspecified site: Secondary | ICD-10-CM | POA: Insufficient documentation

## 2021-04-17 DIAGNOSIS — M533 Sacrococcygeal disorders, not elsewhere classified: Secondary | ICD-10-CM | POA: Insufficient documentation

## 2021-04-17 DIAGNOSIS — R2689 Other abnormalities of gait and mobility: Secondary | ICD-10-CM | POA: Insufficient documentation

## 2021-04-17 NOTE — Patient Instructions (Addendum)
To lift your arches, less tightness at adductor muscles which can help decrease pelvic floor mm tightness    PLEATS: BALLERINA  Heel raises in ballerina position, pressing rolled towel between heels  Hands at a corner of a wall   Knees bent pointed out like a "v" , navel ( center of mass) more forward  Heels together as you lift, pointed out like a "v"  KNEES ARE ALIGNED BEHIND THE TOES TO MINIMIZE STRAIN ON THE KNEES Lower heels down slow with your  navel ( center of mass) more forward to avoid dropping down fast and rocking more weight back onto heels   10 reps    _________  Step ups   Step up L up, R up, L down, R down, exhale up and down, lower heel slow and controlled, shoulder slightly forward, center of mass moves after ballmounds contacts  10 x 3 reps each   _________

## 2021-04-18 NOTE — Therapy (Signed)
[place] MAIN [facility] SERVICES [address] [place], [place], [address] Phone: [phone]   Fax:  [phone]  Physical Therapy Treatment  Patient Details  Name: Alejandro Williamson MRN: [id number] Date of Birth: 1951/02/10 Referring Provider (PT): Candelaria Stagers MD   Encounter Date: 04/17/2021   PT End of Session - 04/18/21 1523    Visit Number 12    Number of Visits --   eval 01/28/21   Date for PT Re-Evaluation 06/06/21   PN 03/27/21   PT Start Time 1503    PT Stop Time 1605    PT Time Calculation (min) 62 min    Activity Tolerance Patient tolerated treatment well    Behavior During Therapy [facility] for tasks assessed/performed           Past Medical History:  Diagnosis Date  . Basal cell carcinoma 04/09/2009, 11/18/2009   right med forehead 3cm sup to right medial brow    No past surgical history on file.  There were no vitals filed for this visit.   Subjective Assessment - 04/17/21 1507    Subjective Pt is noticing pain is less with sitting and he is able to sit longer to 30-45 min with discomfort and not as much discomfort as before.    Patient Stated Goals be able to drive to [facility] and see grandkids              [facility] PT Assessment - 04/18/21 1525      Observation/Other Assessments   Observations adducted big toe L      Coordination   Coordination and Movement Description poor eccentric control of plantar fascia      Palpation   Palpation comment restrictions at C, PA/AP mob at rays , lateral intrinsic feet mm, tightness at L ischial tuberosity, tightness at L adductor                         [facility] Adult PT Treatment/Exercise - 04/18/21 1524      Exercises   Exercises --   see pt instructions     Manual Therapy   Manual therapy comments STM/MWM, distraction at L  TC, PA/AP mob at rays , lateral intrinsic feet mm to promote more toe abduction, extension, DF, STM/MWM at adductors                        PT Long Term Goals - 03/28/21 1154      PT LONG TERM GOAL #1   Title Pt will be able to sit for 30 min on a hard surface in order to prepare to return to church and sit through a whole service    Baseline no able to sit 30 min  ( 3/15: able to sit 30- 45 min )    Time 8    Period Weeks    Status Achieved      PT LONG TERM GOAL #2   Title Pt will report no  numbness along  B thighs above knee after standing for long periods 30-40 min in order to perform ADLs    Baseline numbness along  B thighs above knee after standing for long periods 30-40 min    Time 8    Period Weeks    Status On-going      PT LONG TERM GOAL #3   Title Pt will be IND with car seat modifications in order to ride in variable car  seat s in order to engage in voluteer activities and support wife who depends on him for driving    Baseline not IND    Time 4    Period Weeks    Status Achieved      PT LONG TERM GOAL #4   Title Pt will decrease his FOTO pain score from 50 pts to < 25 pts in order to improve QOL (02/25/21: 38 pts)    Baseline 50pts    Time 10    Period Weeks    Status Partially Met      PT LONG TERM GOAL #5   Title Pt will demo equal pelvic girdle height and less spinal deviations, and more reciprocal gait pattern    Baseline L illiac crest higher,  limited R anterior pelvic rotation with swing phase    Time 2    Period Weeks    Status Achieved      PT LONG TERM GOAL #6   Title Pt will demo increased strength on RLE from 3+/5 to > 4/5 hip flexion, hip abd, hip ext, and increased hip ext from 10 deg to > 20 deg ( L 20 deg) ( tested in sidelying) in order to walk 10K steps with wife    Baseline 3+/5 hip flexion, hip abd, hip ext    Time 6    Period Weeks    Status On-going      PT LONG TERM GOAL #7   Title Flare ups with pain decrease from   3-4 x a week  to < 2-3 x week    Time 6    Period Weeks    Status Achieved      PT LONG TERM GOAL #8   Title Pt will be  able to sit on a chair at home  for 60 min in order to return to church services    Time 4    Period Weeks    Status On-going      PT LONG TERM GOAL  #9   TITLE Pt will demo no cues for backward stepping/ lunges in order to improve propioception    Time 2    Period Weeks    Status On-going                 Plan - 04/18/21 1526    Clinical Impression Statement Pt continues to make progress with less pelvic pain and gradually increasing sitting tolerance. Pt still showed tightness at L ischial tuberosity,L adductor,  L intrinsic feet tightness/ DF limitation, hallux valgus of digit I.  These lower kinetic chain deficits were addressed. Progressed pt to increase toe extension, push off, eccentric control of plantar fascia will help regain the function related to hallux valgus. Suspect L pelvic floor tightness is related to these lower kinetic chain deficits. Anticipate by addressing these lower kinetic chain deficits, pt will continue to make long lasting changes and reach his goals. Pt continues to benefit from skilled PT   Examination-Activity Limitations Stand;Sit    Rehab Potential Good    PT Frequency 1x / week    PT Duration Other (comment)   10   PT Treatment/Interventions ADLs/Self Care Home Management;Stair training;Therapeutic activities;Therapeutic exercise;Moist Heat;Neuromuscular re-education;Balance training;Scar mobilization;Manual techniques;Functional mobility training;Traction;Cryotherapy;Dry needling;Patient/family education;Compression bandaging;Taping;Splinting;Gait training;Energy conservation;Spinal Manipulations;Joint Manipulations    Consulted and Agree with Plan of Care Patient           Patient will benefit from skilled therapeutic intervention in order to improve the following deficits  and impairments:  Hypomobility,Decreased strength,Decreased range of motion,Decreased endurance,Increased muscle spasms,Decreased scar mobility,Decreased mobility,Decreased  coordination,Abnormal gait,Decreased activity tolerance,Improper body mechanics,Pain,Postural dysfunction  Visit Diagnosis: Sacrococcygeal disorders, not elsewhere classified  Other abnormalities of gait and mobility  Unequal leg length     Problem List There are no problems to display for this patient.   Jerl Mina ,PT, DPT, E-RYT  04/18/2021, 3:28 PM  [place] MAIN [facility] SERVICES [address] [place], [place], [address] Phone: [phone]   Fax:  [phone]  Name: Alejandro Williamson MRN: [id number] Date of Birth: 17-Jan-1951

## 2021-04-24 ENCOUNTER — Ambulatory Visit: Payer: Medicare HMO | Admitting: Physical Therapy

## 2021-05-01 ENCOUNTER — Other Ambulatory Visit: Payer: Self-pay

## 2021-05-01 ENCOUNTER — Ambulatory Visit: Payer: Medicare HMO | Admitting: Physical Therapy

## 2021-05-01 DIAGNOSIS — M533 Sacrococcygeal disorders, not elsewhere classified: Secondary | ICD-10-CM | POA: Diagnosis not present

## 2021-05-01 DIAGNOSIS — M2170 Unequal limb length (acquired), unspecified site: Secondary | ICD-10-CM

## 2021-05-01 DIAGNOSIS — R2689 Other abnormalities of gait and mobility: Secondary | ICD-10-CM

## 2021-05-01 NOTE — Patient Instructions (Signed)
Applying Pelvic tilts:  Finding a comfortable position when laying on your back  Laying on your back, lift hips up, then scoot tail under, lowering ribs / midback first, then the low back Pillow under knees      Pelvic tilts Forward, Back, and Neutral   STANDING ( neutral is where you want to practice finding more and more often. More weight across the ball mound of feet and heels not only the heels, and not locking the knees.   SITTING: feet under knees, hip width apart. Weigh on the sitting bones. Thumb on the back iliac crest, Index finger at the front of the hip. Rock through 3 positions to find neutral, press in the feet and sense the sitting bones ( ischial tuberosity) in contact to the seat.   Posterior tilt ( thumb is lower)  Anterior tilt ( index finger is lower).   Neutral ( thumb and index finger is levelled)    Sitting at work chair that may have a dip in the seat Place folded towel/ blanket placed towards the back of the seat , sitting on sitting bones , don't lean to the back of the ch

## 2021-05-02 NOTE — Therapy (Signed)
[place] MAIN [facility] SERVICES [address] [place], [place], [address] Phone: [phone]   Fax:  [phone]  Physical Therapy Treatment  Patient Details  Name: Alejandro Williamson MRN: [id number] Date of Birth: Feb 04, 1951 Referring Provider (PT): Candelaria Stagers MD   Encounter Date: 05/01/2021   PT End of Session - 05/01/21 1600    Visit Number 13    Number of Visits --   eval 01/28/21   Date for PT Re-Evaluation 06/06/21   PN 03/27/21   PT Start Time 1505    PT Stop Time 1601    PT Time Calculation (min) 56 min    Activity Tolerance Patient tolerated treatment well    Behavior During Therapy [facility] for tasks assessed/performed           Past Medical History:  Diagnosis Date  . Basal cell carcinoma 04/09/2009, 11/18/2009   right med forehead 3cm sup to right medial brow    No past surgical history on file.  There were no vitals filed for this visit.   Subjective Assessment - 05/01/21 1509    Subjective Pt is noticing sitting on sift surfaces . It is getting incrementally better but not rapidly better    Patient Stated Goals be able to drive to [facility] and see grandkids              [facility] PT Assessment - 05/01/21 1555      Coordination   Coordination and Movement Description poor propicoeption and awareness of anterior tilt of pelvis in sidelying, supine, sitting, standing                      Pelvic Floor Special Questions - 05/01/21 1553    Pelvic Floor Internal Exam pt consented verbally  and had no contraind/ications    Palpation tightness circularly puborectalis B             [facility] Adult PT Treatment/Exercise - 05/02/21 1141      Therapeutic Activites    Other Therapeutic Activities discussed goal to return to running, advised gettign small trampoline and next session will use one here i the gym so he can use it at  home to improve foot arches in preparation for minimizing overactivity of pelvic floor and minimize  medial collapse of arches      Neuro Re-ed    Neuro Re-ed Details  cued with tactile/ visual  propicoeption and awareness of anterior tilt of pelvis in sidelying, supine, sitting, standing      Moist Heat Therapy   Moist Heat Location Other (comment)   sacrum     Manual Therapy   Manual therapy comments STM/MWM at puborectalis                       PT Long Term Goals - 05/02/21 1140      PT LONG TERM GOAL #1   Title Pt will be able to sit for 30 min on a hard surface in order to prepare to return to church and sit through a whole service    Baseline no able to sit 30 min  ( 3/15: able to sit 30- 45 min )    Time 8    Period Weeks    Status Achieved      PT LONG TERM GOAL #2   Title Pt will report no  numbness along  B thighs above knee after standing for long periods 30-40 min in order  to perform ADLs    Baseline numbness along  B thighs above knee after standing for long periods 30-40 min    Time 8    Period Weeks    Status On-going      PT LONG TERM GOAL #3   Title Pt will be IND with car seat modifications in order to ride in variable car seat s in order to engage in voluteer activities and support wife who depends on him for driving    Baseline not IND    Time 4    Period Weeks    Status Achieved      PT LONG TERM GOAL #4   Title Pt will decrease his FOTO pain score from 50 pts to < 25 pts in order to improve QOL (02/25/21: 38 pts)    Baseline 50pts    Time 10    Period Weeks    Status Partially Met      PT LONG TERM GOAL #5   Title Pt will demo equal pelvic girdle height and less spinal deviations, and more reciprocal gait pattern    Baseline L illiac crest higher,  limited R anterior pelvic rotation with swing phase    Time 2    Period Weeks    Status Achieved      PT LONG TERM GOAL #6   Title Pt will demo increased strength on RLE from 3+/5 to > 4/5 hip flexion, hip abd, hip ext, and increased hip ext from 10 deg to > 20 deg ( L 20 deg) ( tested  in sidelying) in order to walk 10K steps with wife    Baseline 3+/5 hip flexion, hip abd, hip ext    Time 6    Period Weeks    Status On-going      PT LONG TERM GOAL #7   Title Flare ups with pain decrease from   3-4 x a week  to < 2-3 x week    Time 6    Period Weeks    Status Achieved      PT LONG TERM GOAL #8   Title Pt will be able to sit on a chair at home  for 60 min in order to return to church services    Time 4    Period Weeks    Status On-going      PT LONG TERM GOAL  #9   TITLE Pt will demo no cues for backward stepping/ lunges in order to improve propioception    Time 2    Period Weeks    Status On-going                 Plan - 05/01/21 1603    Clinical Impression Statement Internal pelvic floor assessment showed increased tightness at 3rd layer of mm puborectalis bilaterally. Pt tolerated internal Tx without complaints and demo'd improved lengthening post Tx.  Therapist discussed goal to return to running, advised gettign small trampoline. Plan at next session to show pt how to use one here in the gym to improve foot arches in preparation for minimizing overactivity of pelvic floor and minimize medial collapse of arches to help pt return to running.   Pt continues to benefit from skilled PT      Examination-Activity Limitations Stand;Sit    Rehab Potential Good    PT Frequency 1x / week    PT Duration Other (comment)   10   PT Treatment/Interventions ADLs/Self Care Home Management;Stair training;Therapeutic activities;Therapeutic exercise;Moist Heat;Neuromuscular  re-education;Balance training;Scar mobilization;Manual techniques;Functional mobility training;Traction;Cryotherapy;Dry needling;Patient/family education;Compression bandaging;Taping;Splinting;Gait training;Energy conservation;Spinal Manipulations;Joint Manipulations    Consulted and Agree with Plan of Care Patient           Patient will benefit from skilled therapeutic intervention in order to  improve the following deficits and impairments:  Hypomobility,Decreased strength,Decreased range of motion,Decreased endurance,Increased muscle spasms,Decreased scar mobility,Decreased mobility,Decreased coordination,Abnormal gait,Decreased activity tolerance,Improper body mechanics,Pain,Postural dysfunction  Visit Diagnosis: Sacrococcygeal disorders, not elsewhere classified  Other abnormalities of gait and mobility  Unequal leg length     Problem List There are no problems to display for this patient.   Jerl Mina ,PT, DPT, E-RYT  05/02/2021, 11:41 AM  [place] MAIN [facility] SERVICES [address] [place], [place], [address] Phone: [phone]   Fax:  [phone]  Name: Alejandro Williamson MRN: [id number] Date of Birth: Nov 30, 1951

## 2021-05-08 ENCOUNTER — Ambulatory Visit: Payer: Medicare HMO | Admitting: Physical Therapy

## 2021-05-15 ENCOUNTER — Ambulatory Visit: Payer: Medicare HMO | Admitting: Physical Therapy

## 2021-05-22 ENCOUNTER — Other Ambulatory Visit: Payer: Self-pay

## 2021-05-22 ENCOUNTER — Ambulatory Visit: Payer: Medicare HMO | Attending: Sports Medicine | Admitting: Physical Therapy

## 2021-05-22 DIAGNOSIS — R2689 Other abnormalities of gait and mobility: Secondary | ICD-10-CM | POA: Diagnosis present

## 2021-05-22 DIAGNOSIS — M533 Sacrococcygeal disorders, not elsewhere classified: Secondary | ICD-10-CM | POA: Diagnosis not present

## 2021-05-22 DIAGNOSIS — M2170 Unequal limb length (acquired), unspecified site: Secondary | ICD-10-CM | POA: Insufficient documentation

## 2021-05-22 NOTE — Patient Instructions (Signed)
Stretches : (Cuing provided for proper alignment)  Instructions start with Strap on R    Stretches for your legs: LAYING on Back Use upper arms and elbows for stability when pulling strap Opposite knee bent and foot firm in align with hip   Strap on ballmound:  Hip socket  _strap, L knee bent, R ballmound against strap and spread toes, rolling foot 15 deg out and in across midline.  10 reps each side   Hamstring _knee bends  10 reps  With knee pointing straight ( slightly to outside to minimize snapping sensation)      10 reps with knee pointing out towards armpit ( notice the stretch in the medial hamstring muscle)     Adductors and pelvic floor ( Happy Baby) : Delane Ginger are wide towards armpits, sole of feet towards ceiling    IT band _scoot hips to R, cross R leg over L and straighten knee with strap on ballmound,  slide strap behind thighs - Bring thigh towards chest nd feel stretch along back  Keep strap behind thigh, come back on your back    Quad in sidelying _strap around the ankle, pulling ankle towards buttocks  Bottom leg firm and stabilization with knee bent      Hip abductors ( figure 4)      Strap under R thigh, L ankle over R thigh ( stretching L glut)     5  Breaths

## 2021-05-23 NOTE — Therapy (Signed)
[place] MAIN [facility] SERVICES [address] [place], [place], [address] Phone: [phone]   Fax:  [phone]  Physical Therapy Treatment  Patient Details  Name: Alejandro Williamson MRN: [id number] Date of Birth: 11/12/1951 Referring Provider (PT): Candelaria Stagers MD   Encounter Date: 05/22/2021   PT End of Session - 05/22/21 1421     Visit Number 14    Number of Visits --   eval 01/28/21   Date for PT Re-Evaluation 06/06/21   PN 03/27/21   PT Start Time 1402    PT Stop Time 1500    PT Time Calculation (min) 58 min    Activity Tolerance Patient tolerated treatment well    Behavior During Therapy [facility] for tasks assessed/performed             Past Medical History:  Diagnosis Date   Basal cell carcinoma 04/09/2009, 11/18/2009   right med forehead 3cm sup to right medial brow    No past surgical history on file.  There were no vitals filed for this visit.   Subjective Assessment - 05/22/21 1405     Subjective Pt reported hemorrhoid flare up a few days after last session with rectal Tx 3 weeks ago. Pt then had 4-5 "good days" for one week and pt felt he might be ready to return to church. Then one day last week, discomfort with sitting came back from 2/10 to 8/10. Today, the pain is 4/10. Pt recalled unplugging a lamp and felt a pulling sensation around the sitting bone.    Patient Stated Goals be able to drive to [facility] and see grandkids                [facility] PT Assessment - 05/23/21 3007       Flexibility   Hamstrings tightness B    Quadriceps tightness L > R    ITB tightness B    Piriformis tightness B      Palpation   Spinal mobility no tightness at throacic, cervical spine, no forward head    SI assessment  standing: L iliac crest higher than R, patella L higher than R, preTx: levelled in supine, ( Post Tx: standing : levelled)                           [facility] Adult PT Treatment/Exercise - 05/23/21 0941        Neuro Re-ed    Neuro Re-ed Details  cued for lower kinetic chain stretches with straps,      Manual Therapy   Manual therapy comments LLE long axis distraction, STM/MWM at glut med/ minimis L, QL L                         PT Long Term Goals - 05/02/21 1140       PT LONG TERM GOAL #1   Title Pt will be able to sit for 30 min on a hard surface in order to prepare to return to church and sit through a whole service    Baseline no able to sit 30 min  ( 3/15: able to sit 30- 45 min )    Time 8    Period Weeks    Status Achieved      PT LONG TERM GOAL #2   Title Pt will report no  numbness along  B thighs above knee after standing for long periods 30-40 min  in order to perform ADLs    Baseline numbness along  B thighs above knee after standing for long periods 30-40 min    Time 8    Period Weeks    Status On-going      PT LONG TERM GOAL #3   Title Pt will be IND with car seat modifications in order to ride in variable car seat s in order to engage in voluteer activities and support wife who depends on him for driving    Baseline not IND    Time 4    Period Weeks    Status Achieved      PT LONG TERM GOAL #4   Title Pt will decrease his FOTO pain score from 50 pts to < 25 pts in order to improve QOL (02/25/21: 38 pts)    Baseline 50pts    Time 10    Period Weeks    Status Partially Met      PT LONG TERM GOAL #5   Title Pt will demo equal pelvic girdle height and less spinal deviations, and more reciprocal gait pattern    Baseline L illiac crest higher,  limited R anterior pelvic rotation with swing phase    Time 2    Period Weeks    Status Achieved      PT LONG TERM GOAL #6   Title Pt will demo increased strength on RLE from 3+/5 to > 4/5 hip flexion, hip abd, hip ext, and increased hip ext from 10 deg to > 20 deg ( L 20 deg) ( tested in sidelying) in order to walk 10K steps with wife    Baseline 3+/5 hip flexion, hip abd, hip ext    Time 6    Period Weeks     Status On-going      PT LONG TERM GOAL #7   Title Flare ups with pain decrease from   3-4 x a week  to < 2-3 x week    Time 6    Period Weeks    Status Achieved      PT LONG TERM GOAL #8   Title Pt will be able to sit on a chair at home  for 60 min in order to return to church services    Time 4    Period Weeks    Status On-going      PT LONG TERM GOAL  #9   TITLE Pt will demo no cues for backward stepping/ lunges in order to improve propioception    Time 2    Period Weeks    Status On-going                   Plan - 05/22/21 1421     Clinical Impression Statement Pt had positive response to rectal internal Tx as pt reported having more "good days" 4-5 days out of 7 days following Tx. Pt sent close to being ready to return to church and be able to sit through a sermon. However, pt experienced a relapse last week.  Pt felt depressed and discouraged. Pt was provided resources and encouragement via email and again today during the session.   Upon assessment today, Pt continues to demo significantly improved posture, no more forward head nor asymmetrical mm imbalance along spine.   Pt showed higher L iliac crest crest again which was contributed not to true length difference but musculoskeletal in origin. Pt showed signficant tightness in global mm of his lower kinetic chain. Pt  used to be a runner and would like to return to running. Pt was provided a thorough stretching routine for these mm to help with mm pliability before initiating return to running phase. Pt demo'd levelled iliac crest after guided stretching routine today.  Anticipate pt will continue to make positive improvement and move beyond this relapse.  Pt continues to benefit from skilled PT.     Examination-Activity Limitations Stand;Sit    Rehab Potential Good    PT Frequency 1x / week    PT Duration Other (comment)   10   PT Treatment/Interventions ADLs/Self Care Home Management;Stair training;Therapeutic  activities;Therapeutic exercise;Moist Heat;Neuromuscular re-education;Balance training;Scar mobilization;Manual techniques;Functional mobility training;Traction;Cryotherapy;Dry needling;Patient/family education;Compression bandaging;Taping;Splinting;Gait training;Energy conservation;Spinal Manipulations;Joint Manipulations    Consulted and Agree with Plan of Care Patient             Patient will benefit from skilled therapeutic intervention in order to improve the following deficits and impairments:  Hypomobility, Decreased strength, Decreased range of motion, Decreased endurance, Increased muscle spasms, Decreased scar mobility, Decreased mobility, Decreased coordination, Abnormal gait, Decreased activity tolerance, Improper body mechanics, Pain, Postural dysfunction  Visit Diagnosis: Sacrococcygeal disorders, not elsewhere classified  Other abnormalities of gait and mobility  Unequal leg length     Problem List There are no problems to display for this patient.   Jerl Mina ,PT, DPT, E-RYT  05/23/2021, 9:50 AM  [place] MAIN [facility] SERVICES [address] [place], [place], [address] Phone: [phone]   Fax:  [phone]  Name: Alejandro Williamson MRN: [id number] Date of Birth: 10/04/1951

## 2021-05-26 ENCOUNTER — Ambulatory Visit: Payer: Medicare HMO | Admitting: Physical Therapy

## 2021-05-26 ENCOUNTER — Other Ambulatory Visit: Payer: Self-pay

## 2021-05-26 DIAGNOSIS — M533 Sacrococcygeal disorders, not elsewhere classified: Secondary | ICD-10-CM | POA: Diagnosis not present

## 2021-05-26 DIAGNOSIS — M2170 Unequal limb length (acquired), unspecified site: Secondary | ICD-10-CM

## 2021-05-26 DIAGNOSIS — R2689 Other abnormalities of gait and mobility: Secondary | ICD-10-CM

## 2021-05-26 NOTE — Patient Instructions (Addendum)
Marching on trampoline   ( be sure to put ballmounds along the diameter line of trampoline weight bearing on heels ) (Be sure to focus on L ballmounds to minimize drop of medial arch, so more pressure across to pinky toe side and feet placed hip width apart)   3 min, rest   2 sets in morning and evening with the entire stretch routine morning and evening   Total of 12 min per day   ___

## 2021-05-27 NOTE — Therapy (Signed)
[place] MAIN [facility] SERVICES [address] [place], [place], [address] Phone: [phone]   Fax:  [phone]  Physical Therapy Treatment  Patient Details  Name: Alejandro Williamson MRN: [id number] Date of Birth: September 14, 1951 Referring Provider (PT): Candelaria Stagers MD   Encounter Date: 05/26/2021   PT End of Session - 05/27/21 0909     Visit Number 15    Number of Visits --   eval 01/28/21   Date for PT Re-Evaluation 06/06/21   PN 03/27/21   PT Start Time 1500    PT Stop Time 1600    PT Time Calculation (min) 60 min    Activity Tolerance Patient tolerated treatment well    Behavior During Therapy [facility] for tasks assessed/performed             Past Medical History:  Diagnosis Date   Basal cell carcinoma 04/09/2009, 11/18/2009   right med forehead 3cm sup to right medial brow    No past surgical history on file.  There were no vitals filed for this visit.   Subjective Assessment - 05/27/21 0911     Subjective Pt reported he has been practicing the relaxation practice and even his wife noticed how much more relaxed he is afterwards. 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 Uncle lives in an assisted living facility. Pt used to run for stress relief. He was told by a doctor to stop running due to a L labral tear of hip and pt reports he noticed the start of his glut pain started after he stopped running    Patient Stated Goals be able to drive to [facility] and see grandkids                [facility] PT Assessment - 05/27/21 1105       Observation/Other Assessments   Observations narrow BOS on trampoline, more medial collpase of L medial arch > R , required cues      Other:   Other/ Comments on trampoline: heel raises on LLE caused c/o L labral pain and thus, withheld from HEP and Tx.                           [facility] Adult PT Treatment/Exercise -  05/27/21 1107       Therapeutic Activites    Other Therapeutic Activities biopsychocial approaches, active listening , explained relationship of nn system and pelvic pain flareups      Neuro Re-ed    Neuro Re-ed Details  cued for propioception for lower kinetic chain and to minimize medial colloapse of L medial arch                         PT Long Term Goals - 05/02/21 1140       PT LONG TERM GOAL #1   Title Pt will be able to sit for 30 min on a hard surface in order to prepare to return to church and sit through a whole service    Baseline no able to sit 30 min  ( 3/15: able to sit 30- 45 min )    Time 8    Period Weeks    Status Achieved      PT LONG TERM GOAL #2   Title Pt will report no  numbness along  B  thighs above knee after standing for long periods 30-40 min in order to perform ADLs    Baseline numbness along  B thighs above knee after standing for long periods 30-40 min    Time 8    Period Weeks    Status On-going      PT LONG TERM GOAL #3   Title Pt will be IND with car seat modifications in order to ride in variable car seat s in order to engage in voluteer activities and support wife who depends on him for driving    Baseline not IND    Time 4    Period Weeks    Status Achieved      PT LONG TERM GOAL #4   Title Pt will decrease his FOTO pain score from 50 pts to < 25 pts in order to improve QOL (02/25/21: 38 pts)    Baseline 50pts    Time 10    Period Weeks    Status Partially Met      PT LONG TERM GOAL #5   Title Pt will demo equal pelvic girdle height and less spinal deviations, and more reciprocal gait pattern    Baseline L illiac crest higher,  limited R anterior pelvic rotation with swing phase    Time 2    Period Weeks    Status Achieved      PT LONG TERM GOAL #6   Title Pt will demo increased strength on RLE from 3+/5 to > 4/5 hip flexion, hip abd, hip ext, and increased hip ext from 10 deg to > 20 deg ( L 20 deg) ( tested in  sidelying) in order to walk 10K steps with wife    Baseline 3+/5 hip flexion, hip abd, hip ext    Time 6    Period Weeks    Status On-going      PT LONG TERM GOAL #7   Title Flare ups with pain decrease from   3-4 x a week  to < 2-3 x week    Time 6    Period Weeks    Status Achieved      PT LONG TERM GOAL #8   Title Pt will be able to sit on a chair at home  for 60 min in order to return to church services    Time 4    Period Weeks    Status On-going      PT LONG TERM GOAL  #9   TITLE Pt will demo no cues for backward stepping/ lunges in order to improve propioception    Time 2    Period Weeks    Status On-going                   Plan - 05/27/21 0909     Clinical Impression Statement  Provided biopsychosocial approaches today and provided resources via email. Pt discovered that his pelvic pain flares up after he interacts with uncle. His wife has helped to point out the correlation that he gets stressed after talking with his uncle. 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   Running used to be a stress reliever for him but he was told by a doctor to stop running due to L hip labral tear. He reported that this L glut pain had started after he stopped running. Pt was encouraged to stop trying to pinpoint the cause for his pain and the importance of having a flexibility  routine with his Hx of running.  He acknowledges he did not stretch enough when he used to run 3-5 mi daily.   Discussed the plan to modify the goal to return to running given pt's Hx of labral tear. Plan to communicate with MD re: updated imaging 2/2 Hx of L labral tear. Withheld heel raises on trampoline as pt c/o L hip labral pain.   He is ok with not returning to fully running.  Initiated 3 min marching on the trampoline to provide an aerobic activity that will also help with strengthening/ proprioception training of his ankles ( L ankle with more medial collapse). He felt  satisfied today with the cardio workout with marching on trampoline even if duration will be 2 sets of 3 min in the morning and evening. Plan to continue with graded cardio program with emphasis on flexibility routine .    Also applied regional interdependent approach today with excessive cuing to promote less L medial arch collapse/pronation. Reviewed and guided pt thorough lower kinetic chain flexibility routine and plan to eventually progress to yoga poses to encourage body awareness.  Plan to continue addressing pelvic floor and lower kinetic chain deficits correlation.   Pt continues to benefit from skilled PT   Examination-Activity Limitations Stand;Sit    Rehab Potential Good    PT Frequency 1x / week    PT Duration Other (comment)   10   PT Treatment/Interventions ADLs/Self Care Home Management;Stair training;Therapeutic activities;Therapeutic exercise;Moist Heat;Neuromuscular re-education;Balance training;Scar mobilization;Manual techniques;Functional mobility training;Traction;Cryotherapy;Dry needling;Patient/family education;Compression bandaging;Taping;Splinting;Gait training;Energy conservation;Spinal Manipulations;Joint Manipulations    Consulted and Agree with Plan of Care Patient             Patient will benefit from skilled therapeutic intervention in order to improve the following deficits and impairments:  Hypomobility, Decreased strength, Decreased range of motion, Decreased endurance, Increased muscle spasms, Decreased scar mobility, Decreased mobility, Decreased coordination, Abnormal gait, Decreased activity tolerance, Improper body mechanics, Pain, Postural dysfunction  Visit Diagnosis: Sacrococcygeal disorders, not elsewhere classified  Other abnormalities of gait and mobility  Unequal leg length     Problem List There are no problems to display for this patient.   Jerl Mina ,PT, DPT, E-RYT  05/27/2021, 11:09 AM  [place] MAIN [facility] SERVICES [address] [place], [place], [address] Phone: [phone]   Fax:  [phone]  Name: Alejandro Williamson MRN: [id number] Date of Birth: 30-Jan-1951

## 2021-06-02 ENCOUNTER — Ambulatory Visit: Payer: Medicare HMO | Admitting: Physical Therapy

## 2021-06-02 ENCOUNTER — Other Ambulatory Visit: Payer: Self-pay

## 2021-06-02 DIAGNOSIS — M533 Sacrococcygeal disorders, not elsewhere classified: Secondary | ICD-10-CM | POA: Diagnosis not present

## 2021-06-02 DIAGNOSIS — R2689 Other abnormalities of gait and mobility: Secondary | ICD-10-CM

## 2021-06-02 NOTE — Patient Instructions (Addendum)
Resources for psychotherapist    Withhold on strap stretches where you lie on your back

## 2021-06-02 NOTE — Therapy (Signed)
[place] MAIN [facility] SERVICES [address] [place], [place], [address] Phone: [phone]   Fax:  [phone]  Physical Therapy Treatment  Patient Details  Name: Alejandro Williamson MRN: [id number] Date of Birth: February 05, 1951 Referring Provider (PT): Candelaria Stagers MD   Encounter Date: 06/02/2021   PT End of Session - 06/02/21 1655     Visit Number 16    Number of Visits --   eval 01/28/21   Date for PT Re-Evaluation 06/06/21   PN 03/27/21   PT Start Time 1103    PT Stop Time 1204    PT Time Calculation (min) 61 min    Activity Tolerance Patient tolerated treatment well    Behavior During Therapy [facility] for tasks assessed/performed             Past Medical History:  Diagnosis Date   Basal cell carcinoma 04/09/2009, 11/18/2009   right med forehead 3cm sup to right medial brow    No past surgical history on file.  There were no vitals filed for this visit.   Subjective Assessment - 06/02/21 1107     Subjective Pt reported the strap stretches irritated his hematoma that came on after his fall on the ice back in January. Pt contacted Dr. Candelaria Stagers about this and was recommended to put heat on it. The heat is helping. The labral tear was aggravated after the trampoline exercise and was gone by mid afternoon after the session. Pt was recommended to contact at [facility] for an injection but they are booked until Sept. Pt reports 4/10 when sitting in office. Pt reports this is impacting his wife because she relies on him to get to places bas she does not drive. He wants to see his grandkids in [place] as it has been 3 years since he has not seen them due to not being able to drive for long hours. Pt did sit through a movie 2 hr 17 min comfortably but his relapse occured after this movie date.    Patient Stated Goals be able to drive to [facility] and see grandkids                [facility] PT Assessment - 06/02/21 1120       Observation/Other  Assessments   Scoliosis L iliac crest higher than R, R convex curve at upper lumbar , R shoulder slightly lowered      Palpation   SI assessment  L iliac crest higher than R, R convex curve at upper lumbar , R shoulder slightly lowered    Palpation comment significantly restricted scars at coccyx ( Hx of removal of pilonoidal cyst 45 years ago), palpation at scar caused referred pain at pudendal nn region                           [facility] Adult PT Treatment/Exercise - 06/02/21 1651       Therapeutic Activites    Other Therapeutic Activities biopsychocial approaches, active listening , explained relationship of nn system and pelvic pain flareups, recommended psychotherapists to add to his team, advised to not perform stretches on his back due to hematoma related pain      Manual Therapy   Manual therapy comments scar release over coccyx    Kinesiotex Facilitate Muscle   over coccyx                        PT  Long Term Goals - 05/02/21 1140       PT LONG TERM GOAL #1   Title Pt will be able to sit for 30 min on a hard surface in order to prepare to return to church and sit through a whole service    Baseline no able to sit 30 min  ( 3/15: able to sit 30- 45 min )    Time 8    Period Weeks    Status Achieved      PT LONG TERM GOAL #2   Title Pt will report no  numbness along  B thighs above knee after standing for long periods 30-40 min in order to perform ADLs    Baseline numbness along  B thighs above knee after standing for long periods 30-40 min    Time 8    Period Weeks    Status On-going      PT LONG TERM GOAL #3   Title Pt will be IND with car seat modifications in order to ride in variable car seat s in order to engage in voluteer activities and support wife who depends on him for driving    Baseline not IND    Time 4    Period Weeks    Status Achieved      PT LONG TERM GOAL #4   Title Pt will decrease his FOTO pain score from 50 pts to <  25 pts in order to improve QOL (02/25/21: 38 pts)    Baseline 50pts    Time 10    Period Weeks    Status Partially Met      PT LONG TERM GOAL #5   Title Pt will demo equal pelvic girdle height and less spinal deviations, and more reciprocal gait pattern    Baseline L illiac crest higher,  limited R anterior pelvic rotation with swing phase    Time 2    Period Weeks    Status Achieved      PT LONG TERM GOAL #6   Title Pt will demo increased strength on RLE from 3+/5 to > 4/5 hip flexion, hip abd, hip ext, and increased hip ext from 10 deg to > 20 deg ( L 20 deg) ( tested in sidelying) in order to walk 10K steps with wife    Baseline 3+/5 hip flexion, hip abd, hip ext    Time 6    Period Weeks    Status On-going      PT LONG TERM GOAL #7   Title Flare ups with pain decrease from   3-4 x a week  to < 2-3 x week    Time 6    Period Weeks    Status Achieved      PT LONG TERM GOAL #8   Title Pt will be able to sit on a chair at home  for 60 min in order to return to church services    Time 4    Period Weeks    Status On-going      PT LONG TERM GOAL  #9   TITLE Pt will demo no cues for backward stepping/ lunges in order to improve propioception    Time 2    Period Weeks    Status On-going                   Plan - 06/02/21 1656     Clinical Impression Statement Addressed pt's report of pain after performing the stretches at last  session. The pain is related to his hematoma that incurred in January from a fall onto ice and is located L glut lateral to sacrum.  Advised pt to withold on the stretches that are performed on his back. Plan to modify at next session  Assessment today showed no discoloration at this site but upon palpation, area was more raised than R glut.  Coccyx area also showed a significantly restricted scar from the removal of pilonoidal cyst 45 years ago. Upon palpation at this area, pt reported he noticed pudendal nn pain at L glut/ pelvic floor that  limits his sitting. Manual Tx was applied to the scars to mobilize them .  Pt's latest milestone was being able to sit through a movie 2hr 17 min over Memorial Day weekend but his flare up occurred after this achievement which was the longest he has been able to sit since [facility]. Anticipate scar mobilization will further help pt achieve more extension to coccyx/ nutation of sacrum to tolerate longer periods of sitting. Plan to continue with this manual Tx at next session and to modify his stretches with consideration to his hematoma and Hx of labral tear.    Pt continues to benefit from skilled PT   Examination-Activity Limitations Stand;Sit    Rehab Potential Good    PT Frequency 1x / week    PT Duration Other (comment)   10   PT Treatment/Interventions ADLs/Self Care Home Management;Stair training;Therapeutic activities;Therapeutic exercise;Moist Heat;Neuromuscular re-education;Balance training;Scar mobilization;Manual techniques;Functional mobility training;Traction;Cryotherapy;Dry needling;Patient/family education;Compression bandaging;Taping;Splinting;Gait training;Energy conservation;Spinal Manipulations;Joint Manipulations    Consulted and Agree with Plan of Care Patient             Patient will benefit from skilled therapeutic intervention in order to improve the following deficits and impairments:  Hypomobility, Decreased strength, Decreased range of motion, Decreased endurance, Increased muscle spasms, Decreased scar mobility, Decreased mobility, Decreased coordination, Abnormal gait, Decreased activity tolerance, Improper body mechanics, Pain, Postural dysfunction  Visit Diagnosis: Sacrococcygeal disorders, not elsewhere classified  Other abnormalities of gait and mobility     Problem List There are no problems to display for this patient.   Jerl Mina ,PT, DPT, E-RYT  06/02/2021, 4:59 PM  [place] MAIN [facility] SERVICES [address] [place], [place], [address] Phone: [phone]   Fax:  [phone]  Name: Alejandro Williamson MRN: [id number] Date of Birth: Jul 07, 1951

## 2021-06-09 ENCOUNTER — Other Ambulatory Visit: Payer: Self-pay

## 2021-06-09 ENCOUNTER — Ambulatory Visit: Payer: Medicare HMO | Admitting: Physical Therapy

## 2021-06-09 DIAGNOSIS — R2689 Other abnormalities of gait and mobility: Secondary | ICD-10-CM

## 2021-06-09 DIAGNOSIS — M533 Sacrococcygeal disorders, not elsewhere classified: Secondary | ICD-10-CM | POA: Diagnosis not present

## 2021-06-09 DIAGNOSIS — M2170 Unequal limb length (acquired), unspecified site: Secondary | ICD-10-CM

## 2021-06-09 NOTE — Patient Instructions (Signed)
   3- foot tap  10 reps  Each side   Hold onto wall   Slightly bend of standing knee, and keep hips above foot   ballmound of opposite leg   taps to each direction and   back to spot under hips- notice equal pressure through both legs, and across ballmound and heels    ___

## 2021-06-10 NOTE — Therapy (Addendum)
[place] MAIN [facility] SERVICES [address] [place], [place], [address] Phone: [phone]   Fax:  [phone]  Physical Therapy Treatment   Patient Details  Name: Alejandro Williamson MRN: [id number] Date of Birth: 03-16-1951 Referring Provider (PT): Candelaria Stagers MD   Encounter Date: 06/09/2021   PT End of Session - 06/10/21 1341     Visit Number 17    Number of Visits --   eval 01/28/21   Date for PT Re-Evaluation 08/19/21   PN 03/27/21, 06/10/21   PT Start Time 1507    PT Stop Time 1603    PT Time Calculation (min) 56 min    Activity Tolerance Patient tolerated treatment well    Behavior During Therapy [facility] for tasks assessed/performed             Past Medical History:  Diagnosis Date   Basal cell carcinoma 04/09/2009, 11/18/2009   right med forehead 3cm sup to right medial brow    No past surgical history on file.  There were no vitals filed for this visit.   Subjective Assessment - 06/09/21 1516     Subjective Pt reported he noticed 30% improvement with sitting pain after last session's focus on tailbone scar.  Last week for 3-4 days, he felt  noticable better than other weeks with sitting.    Patient Stated Goals be able to drive to [facility] and see grandkids                [facility] PT Assessment - 06/10/21 1438       Palpation   Palpation comment significantly restricted scars at coccyx ( Hx of removal of pilonoidal cyst 45 years ago)                           [facility] Adult PT Treatment/Exercise - 06/10/21 1342       Therapeutic Activites    Other Therapeutic Activities active listening, discussed pt's request for letter to [facility] re: relationship of flat arches as one of the factors related to his pain in addition to other factors such as his scar restrictions, spinal deviations      Neuro Re-ed    Neuro Re-ed Details  cued for propioception of 3- foot tap      Manual Therapy   Manual therapy comments scar  release over coccyx                      PT Long Term Goals - 06/10/21 1352       PT LONG TERM GOAL #1   Title Pt will be able to sit for 30 min on a hard surface in order to prepare to return to church and sit through a whole service    Baseline no able to sit 30 min  ( 3/15: able to sit 30- 45 min )    Time 8    Period Weeks    Status Achieved      PT LONG TERM GOAL #2   Title Pt will report no  numbness along  B thighs above knee after standing for long periods 30-40 min in order to perform ADLs    Baseline numbness along  B thighs above knee after standing for long periods 30-40 min    Time 8    Period Weeks    Status Achieved      PT LONG TERM GOAL #3   Title Pt will  be IND with car seat modifications in order to ride in variable car seat s in order to engage in voluteer activities and support wife who depends on him for driving    Baseline not IND    Time 4    Period Weeks    Status Achieved      PT LONG TERM GOAL #4   Title Pt will decrease his FOTO pain score from 50 pts to < 25 pts in order to improve QOL (02/25/21: 38 pts, 06/10/21: 21 pts)    Baseline 50pts    Time 10    Period Weeks    Status Achieved      PT LONG TERM GOAL #5   Title Pt will demo equal pelvic girdle height and less spinal deviations, and more reciprocal gait pattern    Baseline L illiac crest higher,  limited R anterior pelvic rotation with swing phase    Time 2    Period Weeks    Status Achieved      PT LONG TERM GOAL #6   Title Pt will demo increased strength on RLE from 3+/5 to > 4/5 hip flexion, hip abd, hip ext, and increased hip ext from 10 deg to > 20 deg ( L 20 deg) ( tested in sidelying) in order to walk 10K steps with wife    Baseline 3+/5 hip flexion, hip abd, hip ext    Time 6    Period Weeks    Status On-going      PT LONG TERM GOAL #7   Title Flare ups with pain decrease from   3-4 x a week  to < 2-3 x week    Time 6    Period Weeks    Status Achieved      PT  LONG TERM GOAL #8   Title Pt will be able to sit on a chair at home  for 60 min in order to return to church services    Time 4    Period Weeks    Status On-going      PT LONG TERM GOAL  #9   TITLE Pt will demo no cues for backward stepping/ lunges in order to improve propioception    Time 2    Period Weeks    Status On-going                     Plan - 06/10/21 1341     Clinical Impression Statement Pt has achieved 5/9 goals and progressing to remaining goals. Pt had increased his sitting tolerance and was able to sit through a movie for ~2.5 hours but experienced a relapse afterwards. The past sessions have been focused on helping pt return to his improvements by addressing factors including coccyx scar restrictions where he had Hx of removal of pilonoidal cyst 45 years ago), pelvic obliquities, lower kinetic chain deficits: medial collapse of arches L > R, and Hx of hematoma from a fall onto L buttock in Jan 2022 and Hx of labral tear. Pt has been educated on biopsychosocial approaches as he also noticed increased pain with increased stressors in his life (caretaking for uncle, PTSD from serving in [place]). Recommended pt add a psychotherapist to his interdisciplinary team. Pt's referring provider referred pt to a pain specialist at [facility] but there was not openings until Sept. Pt's hip doctor Dr. Starlyn Skeans referred him to Dr. Boyd Kerbs at [facility] and has an appt scheduled this week.   After  treating the coccyx restrictions last session,  pt reported 30% improvement. Still withholding ankle strength/ arch strengthening on trampoline due to his Hx of labral tear in L hip and withholding LE stretches with strap while in supine due to his c/o pain at his hematoma from a fall he had in Jan 2022.   Pt continues to benefit from skilled PT.           Examination-Activity Limitations Stand;Sit    Rehab Potential Good    PT Frequency 1x / week    PT Duration Other  (comment)   10   PT Treatment/Interventions ADLs/Self Care Home Management;Stair training;Therapeutic activities;Therapeutic exercise;Moist Heat;Neuromuscular re-education;Balance training;Scar mobilization;Manual techniques;Functional mobility training;Traction;Cryotherapy;Dry needling;Patient/family education;Compression bandaging;Taping;Splinting;Gait training;Energy conservation;Spinal Manipulations;Joint Manipulations    Consulted and Agree with Plan of Care Patient             Patient will benefit from skilled therapeutic intervention in order to improve the following deficits and impairments:  Hypomobility, Decreased strength, Decreased range of motion, Decreased endurance, Increased muscle spasms, Decreased scar mobility, Decreased mobility, Decreased coordination, Abnormal gait, Decreased activity tolerance, Improper body mechanics, Pain, Postural dysfunction  Visit Diagnosis: Sacrococcygeal disorders, not elsewhere classified  Other abnormalities of gait and mobility  Unequal leg length     Problem List There are no problems to display for this patient.   Jerl Mina 06/10/2021, 2:39 PM  [place] MAIN [facility] SERVICES [address] [place], [place], [address] Phone: [phone]   Fax:  [phone]  Name: Murdock Jellison MRN: [id number] Date of Birth: 1951/08/08

## 2021-06-17 ENCOUNTER — Ambulatory Visit: Payer: Medicare HMO | Attending: Sports Medicine | Admitting: Physical Therapy

## 2021-06-17 ENCOUNTER — Other Ambulatory Visit: Payer: Self-pay

## 2021-06-17 DIAGNOSIS — M533 Sacrococcygeal disorders, not elsewhere classified: Secondary | ICD-10-CM | POA: Diagnosis not present

## 2021-06-17 DIAGNOSIS — R2689 Other abnormalities of gait and mobility: Secondary | ICD-10-CM | POA: Insufficient documentation

## 2021-06-17 DIAGNOSIS — M2170 Unequal limb length (acquired), unspecified site: Secondary | ICD-10-CM | POA: Insufficient documentation

## 2021-06-17 NOTE — Addendum Note (Signed)
Addended by: Jerl Mina on: 06/17/2021 02:27 PM   Modules accepted: Orders

## 2021-06-18 NOTE — Therapy (Signed)
[place] MAIN [facility] SERVICES [address] [place], [place], [address] Phone: [phone]   Fax:  [phone]  Physical Therapy Treatment  Patient Details  Name: Alejandro Williamson MRN: [id number] Date of Birth: 02/03/1951 Referring Provider (PT): Candelaria Stagers MD   Encounter Date: 06/17/2021   PT End of Session - 06/17/21 1412     Visit Number 18    Number of Visits --   eval 01/28/21   Date for PT Re-Evaluation 08/19/21   PN 03/27/21, 06/10/21   PT Start Time 1404    PT Stop Time 1500    PT Time Calculation (min) 56 min    Activity Tolerance Patient tolerated treatment well    Behavior During Therapy [facility] for tasks assessed/performed             Past Medical History:  Diagnosis Date   Basal cell carcinoma 04/09/2009, 11/18/2009   right med forehead 3cm sup to right medial brow    No past surgical history on file.  There were no vitals filed for this visit.   Subjective Assessment - 06/17/21 1412     Subjective Pt reported he saw Dr. Boyd Kerbs at the [facility] and pudendal nerve block injection is scheduled next Monday that ablation wil be the next option. The shot can relief pain for 6 hours.  The hematoma area from his fall in [place] was assessed bythe pain doctor and he does not think it is a  hematoma but a mm spasm and that the nerve block will hope to address this Pt felt that the last 2 sessions with Tx on the tailbone scar was "good thing". Pt had 5-6 "good days" after last session. Pt noticed throwing a baseball to his grandson but it flared up his pelvic pain. Pt was able to calm it down with heat and stretches.    Patient Stated Goals be able to drive to [facility] and see grandkids                [facility] PT Assessment - 06/18/21 1101       Observation/Other Assessments   Observations no pain with strap exercises      Palpation   Palpation comment significantly restricted scar proxinal end, L lateral border,  at coccyx ( Hx of  removal of pilonoidal cyst 45 years ago)                           [facility] Adult PT Treatment/Exercise - 06/18/21 1102       Therapeutic Activites    Other Therapeutic Activities active listening to pt's appt with Pain clinic      Neuro Re-ed    Neuro Re-ed Details  cued for transverse arch co-activation , improve plant arch with cue for placement of ballmounds on strap in hamstring hip stretches      Manual Therapy   Manual therapy comments scar release over coccyx ( proximal, L lateral border)                         PT Long Term Goals - 06/18/21 1033       PT LONG TERM GOAL #1   Title Pt will be able to sit for 30 min on a hard surface in order to prepare to return to church and sit through a whole service    Baseline no able to sit 30 min  ( 3/15: able to  sit 30- 45 min )    Time 8    Period Weeks    Status Achieved      PT LONG TERM GOAL #2   Title Pt will report no  numbness along  B thighs above knee after standing for long periods 30-40 min in order to perform ADLs    Baseline numbness along  B thighs above knee after standing for long periods 30-40 min    Time 8    Period Weeks    Status Achieved      PT LONG TERM GOAL #3   Title Pt will be IND with car seat modifications in order to ride in variable car seat s in order to engage in voluteer activities and support wife who depends on him for driving    Baseline not IND    Time 4    Period Weeks    Status Achieved      PT LONG TERM GOAL #4   Title Pt will decrease his FOTO pain score from 50 pts to < 25 pts in order to improve QOL (02/25/21: 38 pts, 06/10/21: 21 pts)    Baseline 50pts    Time 10    Period Weeks    Status Achieved      PT LONG TERM GOAL #5   Title Pt will demo equal pelvic girdle height and less spinal deviations, and more reciprocal gait pattern    Baseline L illiac crest higher,  limited R anterior pelvic rotation with swing phase    Time 2    Period Weeks     Status Achieved      PT LONG TERM GOAL #6   Title Pt will demo increased strength on RLE from 3+/5 to > 4/5 hip flexion, hip abd, hip ext, and increased hip ext from 10 deg to > 20 deg ( L 20 deg) ( tested in sidelying) in order to walk 10K steps with wife    Baseline 3+/5 hip flexion, hip abd, hip ext    Time 6    Period Weeks    Status On-going      PT LONG TERM GOAL #7   Title Flare ups with pain decrease from   3-4 x a week  to < 2-3 x week    Time 6    Period Weeks    Status Achieved      PT LONG TERM GOAL #8   Title Pt will be able to sit on a chair at home  for 60 min in order to return to church services    Time 4    Period Weeks    Status On-going      PT LONG TERM GOAL  #9   TITLE Pt will demo no cues for backward stepping/ lunges in order to improve propioception    Time 2    Period Weeks    Status On-going                   Plan - 06/18/21 1104     Clinical Impression Statement Pt continues to respond to scar releases over his coccyx in a positive way after the 2nd Tx focused on this area as pt reports pt had 5-6 "good days" . However, he had a slight flare up after throwing and playing baseball with grandson. He was able to self-manage with stretches and the flare up resolved.   Continued to apply scar mobilization today at proximal/ lateral end of scar  today in sidelying position and utilized movement with mobilization ( MWM) techniques.Anticipate pt will be able to perform cross-diagonal movements with less pain with more Tx sessions in order to help pt play baseball with grandson.    Pt will be receiving a pudendal nn block with pain specialist Dr. Boyd Kerbs ( [facility] in [place]) next week.   Pt has referred to an area that is located to L buttocks lateral to coccyx where he had a fall in Jan 2022 as "hematoma".  This area was aggravated by stretches performed with a strap a few sessions ago.  Thus, stretches with strap was withheld.This was assessed by pain doctor  and pt explained he was told that MD anticipates the nerve block will help with this area  and that thinks is not a hematoma but a mm spasm. No direct Tx has been applied to this area the past sessions.    Today, pt tried these stretches with strap again with cues for proper technique of feet ( activating transverse and plantar arch) and pt reported no pain in this area afterwards. Suspect the scar releases over coccyx have helped and pt will be able to return to his stretches for global mm with use of strap to minimize hamstring, quad, hip flexor tightness.   Discussed cancelling next week's appt as pt will be receiving his pudendal nn block.   Resume PT with plan to finish eliminating remaining coccyx scar restrictions. Plan to gradually return to upright functional exercises to build plantar arches to help pt return to jogging as pt enjoyed running in the past for exercise and stress relief.  Plan to continue monitoring hip issues with new exercises and will modify with consideration to his Hx of L  labral tear.   Pt continues to benefit from skilled PT        Examination-Activity Limitations Stand;Sit    Rehab Potential Good    PT Frequency 1x / week    PT Duration Other (comment)   10   PT Treatment/Interventions ADLs/Self Care Home Management;Stair training;Therapeutic activities;Therapeutic exercise;Moist Heat;Neuromuscular re-education;Balance training;Scar mobilization;Manual techniques;Functional mobility training;Traction;Cryotherapy;Dry needling;Patient/family education;Compression bandaging;Taping;Splinting;Gait training;Energy conservation;Spinal Manipulations;Joint Manipulations    Consulted and Agree with Plan of Care Patient             Patient will benefit from skilled therapeutic intervention in order to improve the following deficits and impairments:  Hypomobility, Decreased strength, Decreased range of motion, Decreased endurance, Increased muscle spasms, Decreased scar  mobility, Decreased mobility, Decreased coordination, Abnormal gait, Decreased activity tolerance, Improper body mechanics, Pain, Postural dysfunction  Visit Diagnosis: Sacrococcygeal disorders, not elsewhere classified  Unequal leg length  Other abnormalities of gait and mobility     Problem List There are no problems to display for this patient.   Jerl Mina ,PT, DPT, E-RYT  06/18/2021, 11:09 AM  [place] MAIN [facility] SERVICES [address] [place], [place], [address] Phone: [phone]   Fax:  [phone]  Name: Jerrad Mendibles MRN: [id number] Date of Birth: [birth year]

## 2021-06-24 ENCOUNTER — Ambulatory Visit: Payer: Medicare HMO | Admitting: Physical Therapy

## 2021-07-01 ENCOUNTER — Other Ambulatory Visit: Payer: Self-pay

## 2021-07-01 ENCOUNTER — Ambulatory Visit: Payer: Medicare HMO | Admitting: Physical Therapy

## 2021-07-01 DIAGNOSIS — R2689 Other abnormalities of gait and mobility: Secondary | ICD-10-CM

## 2021-07-01 DIAGNOSIS — M2170 Unequal limb length (acquired), unspecified site: Secondary | ICD-10-CM

## 2021-07-01 DIAGNOSIS — M533 Sacrococcygeal disorders, not elsewhere classified: Secondary | ICD-10-CM | POA: Diagnosis not present

## 2021-07-02 NOTE — Therapy (Addendum)
[place] MAIN [facility] SERVICES [address] [place], [place], [address] Phone: [phone]   Fax:  [phone]  Physical Therapy Treatment  Patient Details  Name: Alejandro Williamson MRN: [id number] Date of Birth: 1951/06/10 Referring Provider (PT): Candelaria Stagers MD   Encounter Date: 07/01/2021   PT End of Session - 07/02/21 1539     Visit Number 19    Number of Visits --   eval 01/28/21   Date for PT Re-Evaluation 08/19/21   PN 03/27/21, 06/10/21   PT Start Time 1404    PT Stop Time 1500    PT Time Calculation (min) 56 min    Activity Tolerance Patient tolerated treatment well    Behavior During Therapy [facility] for tasks assessed/performed             Past Medical History:  Diagnosis Date   Basal cell carcinoma 04/09/2009, 11/18/2009   right med forehead 3cm sup to right medial brow    No past surgical history on file.  There were no vitals filed for this visit.   Subjective Assessment - 07/01/21 1520     Subjective Pt has been able to return his stretches with strap and it causes no pain. Pt notices the area where he fell in Jan 2022 can be triggered by sitting back in a chair or working out in the yard. This area is by L glut but different from the area he came in to Pelvic PT. The pain in this area is a stinging feeling and sometimes it seems linked to the scrotal area.  Pt reported he [facility] Boyd [facility] for the first diagnostic injection of pudendal nerve block on 06/23/21. Pt noticed the pain relief wore off the next day[place], pt noticed pain in the scrotal area and it went away. On the second day, pain went back to baseline. Pt sits in a wooden chair and is comfortable but is uncomfortable in soft chair after 52min. Pt looks for harden surfaces in public places to sit . "I feel like his  baseline is 40% where it used to be at its worst. What we are doing is the right thing but is  slow. Slow is fine because it is better than nothing." Pt is looking in joining a one on one program for PTSD and he finds this more fitting for him because sitting in large groups are not as effective for him.  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 Pt had burnout as a Clinical biochemist when serving in [place] during the [employer]. 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    Patient Stated Goals be able to drive to [facility] and see grandkids                            Pelvic Floor Special Questions - 07/02/21 1539     Pelvic Floor Internal Exam pt consented verbally  and had no contraind/ications    Palpation no tightness circularly puborectalis B, able to lengthen pelvic floor without cues, able to perform anterior tilt of pelvis without cues               [place] [facility] Adult PT Treatment/Exercise - 07/02/21  24       Therapeutic Activites    Other Therapeutic Activities reassessed pelvic floor, discussed POC and active listening to pt's perspective on his progress, Hx of  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 [employer] and currently as a Visual merchandiser to [employer] ,v discussed benefits of adding psychotherapist to team, plan to work on letter to [place] so pt can start a one on one program counseling for PTSD,  referred book "Body Keeps the Score",                         PT Long Term Goals - 06/18/21 1033       PT LONG TERM GOAL #1   Title Pt will be able to sit for 30 min on a hard surface in order to prepare to return to church and sit through a whole service    Baseline no able to sit 30 min  ( 3/15: able to sit 30- 45 min )    Time 8    Period Weeks    Status Achieved      PT LONG TERM GOAL #2   Title Pt will report no  numbness along  B thighs above knee after standing for long periods 30-40 min in order to perform ADLs     Baseline numbness along  B thighs above knee after standing for long periods 30-40 min    Time 8    Period Weeks    Status Achieved      PT LONG TERM GOAL #3   Title Pt will be IND with car seat modifications in order to ride in variable car seat s in order to engage in voluteer activities and support wife who depends on him for driving    Baseline not IND    Time 4    Period Weeks    Status Achieved      PT LONG TERM GOAL #4   Title Pt will decrease his FOTO pain score from 50 pts to < 25 pts in order to improve QOL (02/25/21: 38 pts, 06/10/21: 21 pts)    Baseline 50pts    Time 10    Period Weeks    Status Achieved      PT LONG TERM GOAL #5   Title Pt will demo equal pelvic girdle height and less spinal deviations, and more reciprocal gait pattern    Baseline L illiac crest higher,  limited R anterior pelvic rotation with swing phase    Time 2    Period Weeks    Status Achieved      PT LONG TERM GOAL #6   Title Pt will demo increased strength on RLE from 3+/5 to > 4/5 hip flexion, hip abd, hip ext, and increased hip ext from 10 deg to > 20 deg ( L 20 deg) ( tested in sidelying) in order to walk 10K steps with wife    Baseline 3+/5 hip flexion, hip abd, hip ext    Time 6    Period Weeks    Status On-going      PT LONG TERM GOAL #7   Title Flare ups with pain decrease from   3-4 x a week  to < 2-3 x week    Time 6    Period Weeks    Status Achieved      PT LONG TERM GOAL #8   Title Pt will be able to sit on a chair at  home  for 60 min in order to return to church services    Time 4    Period Weeks    Status On-going      PT LONG TERM GOAL  #9   TITLE Pt will demo no cues for backward stepping/ lunges in order to improve propioception    Time 2    Period Weeks    Status On-going                   Plan - 07/02/21 1544     Clinical Impression Statement Pt showed no more tensions at pelvic floor and pt was able to perform anterior pelvic tilt without cues.  Pt will undergo pudendal nerve block on 07/07/21.   Pt has been able to return his stretches with the strap and it causes no pain to the area( L glut)  where he fell in Jan 2022 . He notices it can be triggered by sitting back in a chair or working out in the yard. This area is by L glut but different from the area he came in to Pelvic PT. The pain in this area is a stinging and sometimes it seems linked to the scrotal area. Plan to assess this area more next session . Pt continues to benefit from skilled PT   Examination-Activity Limitations Stand;Sit    Rehab Potential Good    PT Frequency 1x / week    PT Duration Other (comment)   10   PT Treatment/Interventions ADLs/Self Care Home Management;Stair training;Therapeutic activities;Therapeutic exercise;Moist Heat;Neuromuscular re-education;Balance training;Scar mobilization;Manual techniques;Functional mobility training;Traction;Cryotherapy;Dry needling;Patient/family education;Compression bandaging;Taping;Splinting;Gait training;Energy conservation;Spinal Manipulations;Joint Manipulations    Consulted and Agree with Plan of Care Patient             Patient will benefit from skilled therapeutic intervention in order to improve the following deficits and impairments:  Hypomobility, Decreased strength, Decreased range of motion, Decreased endurance, Increased muscle spasms, Decreased scar mobility, Decreased mobility, Decreased coordination, Abnormal gait, Decreased activity tolerance, Improper body mechanics, Pain, Postural dysfunction  Visit Diagnosis: Sacrococcygeal disorders, not elsewhere classified  Unequal leg length  Other abnormalities of gait and mobility     Problem List There are no problems to display for this patient.   Jerl Mina ,PT, DPT, E-RYT  07/02/2021, 3:45 PM  [place] MAIN [facility] SERVICES [address] [place], [place], [address] Phone: [phone]   Fax:   [phone]  Name: Ravon Mortellaro MRN: [id number] Date of Birth: [birth year]

## 2021-07-08 ENCOUNTER — Ambulatory Visit: Payer: Medicare HMO | Admitting: Physical Therapy

## 2021-07-15 ENCOUNTER — Ambulatory Visit: Payer: Medicare HMO | Attending: Sports Medicine | Admitting: Physical Therapy

## 2021-07-15 ENCOUNTER — Other Ambulatory Visit: Payer: Self-pay

## 2021-07-15 DIAGNOSIS — M2170 Unequal limb length (acquired), unspecified site: Secondary | ICD-10-CM | POA: Diagnosis present

## 2021-07-15 DIAGNOSIS — R2689 Other abnormalities of gait and mobility: Secondary | ICD-10-CM | POA: Diagnosis present

## 2021-07-15 DIAGNOSIS — M533 Sacrococcygeal disorders, not elsewhere classified: Secondary | ICD-10-CM | POA: Insufficient documentation

## 2021-07-15 NOTE — Therapy (Signed)
[place] MAIN [facility] SERVICES [address] [place], [place], [address] Phone: [phone]   Fax:  [phone]  Physical Therapy Treatment / Progress Note reporting from 06/11/19-  07/15/21  Patient Details  Name: Alejandro Williamson MRN: [id number] Date of Birth: 02/14/1951 Referring Provider (PT): Candelaria Stagers MD   Encounter Date: 07/15/2021   PT End of Session - 07/15/21 1507     Visit Number 20    Number of Visits --   eval 01/28/21   Date for PT Re-Evaluation 08/19/21   PN 03/27/21, 06/10/21, 07/15/21   PT Start Time 1502    PT Stop Time 1600    PT Time Calculation (min) 58 min    Activity Tolerance Patient tolerated treatment well    Behavior During Therapy [facility] for tasks assessed/performed             Past Medical History:  Diagnosis Date   Basal cell carcinoma 04/09/2009, 11/18/2009   right med forehead 3cm sup to right medial brow    No past surgical history on file.  There were no vitals filed for this visit.   Subjective Assessment - 07/15/21 1506     Subjective Pt had pudendal ablation on 07/07/21. Pt felt relief for 1.5 days. Pt started back with using band with HEP and it caused pain 7/10. Pt backed off on the bands and now today, pain is 4/10.   Therapist's letter for his counselor was received and it was submitted to [place].    Patient Stated Goals be able to drive to [facility] and see grandkids                [facility] PT Assessment - 07/15/21 1513       Single Leg Squat   Comments no UE support  SLS 7 reps, R  SLS 2 reps      AROM   Overall AROM Comments R hip ext 10 deg, L hip ext 20 deg,      Strength   Overall Strength Comments B hip abd, ext 5/5  , B flexion 4+/5,  No reps PF without UE support, single UE supprt L PF 20 reps , R 25 reps before fatigue      Palpation   SI assessment  hypomobile at S2-3 level at L SIJ    Palpation comment tightness at occiput/ mandible L attachment                            [facility] Adult PT Treatment/Exercise - 07/15/21 1724       Therapeutic Activites    Other Therapeutic Activities reassessment of goals, created new goals for lower kinetic chain and L cervical region      Manual Therapy   Manual therapy comments R sidelying: L SIJ mobilization with spinal rotation,  STM/ MWM at occiput / mandible attachments on L                         PT Long Term Goals - 07/15/21 1508       PT LONG TERM GOAL #1   Title Pt will be able to sit for 30 min on a hard surface in order to prepare to return to church and sit through a whole service    Baseline no able to sit 30 min  ( 3/15: able to sit 30- 45 min )    Time 8  Period Weeks    Status Achieved      PT LONG TERM GOAL #2   Title Pt will report no  numbness along  B thighs above knee after standing for long periods 30-40 min in order to perform ADLs    Baseline numbness along  B thighs above knee after standing for long periods 30-40 min    Time 8    Period Weeks    Status Achieved      PT LONG TERM GOAL #3   Title Pt will be IND with car seat modifications in order to ride in variable car seat s in order to engage in voluteer activities and support wife who depends on him for driving    Baseline not IND    Time 4    Period Weeks    Status Achieved      PT LONG TERM GOAL #4   Title Pt will decrease his FOTO pain score from 50 pts to < 25 pts in order to improve QOL (02/25/21: 38 pts, 06/10/21: 21 pts)    Baseline 50pts    Time 10    Period Weeks    Status Achieved      PT LONG TERM GOAL #5   Title Pt will demo equal pelvic girdle height and less spinal deviations, and more reciprocal gait pattern    Baseline L illiac crest higher,  limited R anterior pelvic rotation with swing phase    Time 2    Period Weeks    Status Achieved      Additional Long Term Goals   Additional Long Term Goals --      PT LONG TERM GOAL #6   Title Pt will demo increased  strength on RLE from 3+/5 to > 4/5 hip flexion, hip abd, hip ext, and increased hip ext from 10 deg to > 20 deg ( L 20 deg) ( tested in sidelying) in order to walk 10K steps with wife    Baseline 3+/5 hip flexion, hip abd, hip ext    Time 6    Period Weeks    Status On-going      PT LONG TERM GOAL #7   Title Flare ups with pain decrease from   3-4 x a week  to < 2-3 x week    Time 6    Period Weeks    Status Achieved      PT LONG TERM GOAL #8   Title Pt will be able to sit on a chair at home  for 60 min in order to return to church services    Time 4    Period Weeks    Status On-going      PT LONG TERM GOAL  #9   TITLE Pt will demo no cues for backward stepping/ lunges in order to improve propioception    Time 2    Period Weeks    Status On-going      PT LONG TERM GOAL  #10   TITLE Pt will demo increased R hip ext from from 10 deg to > 15 deg,  improved B SLS without UE support up to > 5 sec in order to improve balance    Baseline R hip ext 10 deg, L hip ext 20 deg and difficulty with B SLS without UE support  07/15/21.    Time 10    Period Weeks    Status New    Target Date 09/23/21      PT  LONG TERM GOAL  #11   TITLE Pt wil demo no hypomobility at S2-3 level at L SIJ in order to minimize pain in the area where he had a fall onto back in Jan 2022 and no pain in this area with band and strap HEP    Time 8    Period Weeks    Status New    Target Date 09/09/21      PT LONG TERM GOAL  #12   TITLE Pt will demo no more tensions at mm attachments of L occiput and mandible across 2 sessions in order to gain ROM when driving.    Time 10    Period Weeks    Status New    Target Date 09/23/21                   Plan - 07/15/21 1507     Clinical Impression Statement Pt has achieved 6/12 goals and progressing well towards remaining goals.   Pt had pudendal ablation last week with pain relief that lasted 1.5 days.  Pt experienced increased pain when he started back on  using resistance band HEP so he backed down. Today his pain is 4/10 instead of 7/10.   Pt demo'd equal pelvic alignment and less thoracic convex curves but L cervical region and scapular area remain limited mobility and slightly higher compared to R. Manual Tx addressed these areas along with hypomobile L SIJ at level of S2-3. Pt showed improved mobility the areas of L neck and SIJ. Plan to address cervical area to improve more mobility for driving which is an important goal of patient's in addition to being able to sit through church service.  At next session, plan to address low standing balance on SLS R  weaker than L without UE support and R limited hip ext compared to L. Suspect cross diagonal fascia connection between restrictions of L cervical / thoracic area to R hip.    Therapist's letter for his counselor was received and it was submitted to [facility] for admission/ coverage for one-on-one counseling for PTSD.   Pt continues to benefit from skilled PT.    Examination-Activity Limitations Stand;Sit    Rehab Potential Good    PT Frequency 1x / week    PT Duration Other (comment)   10   PT Treatment/Interventions ADLs/Self Care Home Management;Stair training;Therapeutic activities;Therapeutic exercise;Moist Heat;Neuromuscular re-education;Balance training;Scar mobilization;Manual techniques;Functional mobility training;Traction;Cryotherapy;Dry needling;Patient/family education;Compression bandaging;Taping;Splinting;Gait training;Energy conservation;Spinal Manipulations;Joint Manipulations    Consulted and Agree with Plan of Care Patient             Patient will benefit from skilled therapeutic intervention in order to improve the following deficits and impairments:  Hypomobility, Decreased strength, Decreased range of motion, Decreased endurance, Increased muscle spasms, Decreased scar mobility, Decreased mobility, Decreased coordination, Abnormal gait, Decreased activity tolerance, Improper body  mechanics, Pain, Postural dysfunction  Visit Diagnosis: Sacrococcygeal disorders, not elsewhere classified  Other abnormalities of gait and mobility     Problem List There are no problems to display for this patient.   Jerl Mina  ,PT, DPT, E-RYT   07/15/2021, 5:26 PM  [place] MAIN [facility] SERVICES [address] [place], [place], [address] Phone: [phone]   Fax:  [phone]  Name: Dudley Johnstone MRN: [id number] Date of Birth: August 03, 1951

## 2021-07-22 ENCOUNTER — Ambulatory Visit: Payer: Medicare HMO | Admitting: Physical Therapy

## 2021-07-22 ENCOUNTER — Other Ambulatory Visit: Payer: Self-pay

## 2021-07-22 DIAGNOSIS — M533 Sacrococcygeal disorders, not elsewhere classified: Secondary | ICD-10-CM

## 2021-07-22 DIAGNOSIS — R2689 Other abnormalities of gait and mobility: Secondary | ICD-10-CM

## 2021-07-22 NOTE — Patient Instructions (Signed)
   Neck / shoulder stretches:    _angel wings, elbows bent , keep arms touching bed  Shoulders down before lowering elbows  10 reps    __  Stillpoint inducer for 5-10 min relaxation not to use with neck exercise   Use towel roll with neck exercises  ___  Practice turning head like changing lanes but drop shoulders down before turning

## 2021-07-22 NOTE — Therapy (Signed)
[place] MAIN [facility] SERVICES [address] [place], [place], [address] Phone: [phone]   Fax:  [phone]  Physical Therapy Treatment  Patient Details  Name: Alejandro Williamson MRN: [id number] Date of Birth: July 06, 1951 Referring Provider (PT): Candelaria Stagers MD   Encounter Date: 07/22/2021   PT End of Session - 07/22/21 1802     Visit Number 21    Number of Visits --   eval 01/28/21   Date for PT Re-Evaluation 08/19/21   PN 03/27/21, 06/10/21, 07/15/21   PT Start Time 1502    PT Stop Time 1600    PT Time Calculation (min) 58 min    Activity Tolerance Patient tolerated treatment well    Behavior During Therapy [facility] for tasks assessed/performed             Past Medical History:  Diagnosis Date   Basal cell carcinoma 04/09/2009, 11/18/2009   right med forehead 3cm sup to right medial brow    No past surgical history on file.  There were no vitals filed for this visit.   Subjective Assessment - 07/22/21 1507     Subjective Pt felt better after last session when driving home. Pt noticed what really helps is doing the relaxation daily. 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 Pt has been scheduled via the [place] for one-on-one counseling and a provider to asess his flat feet.  Pt feels the pudendal nerve block did not work. Pt will try again   Patient Stated Goals be able to drive to [facility] and see grandkids                [facility] PT Assessment - 07/22/21 1510       Observation/Other Assessments   Observations angel wings by wall with limited ROM, deferred to supine position for HEP      Coordination   Coordination and Movement Description delayed downward motion of scapula on L, shoulder abduction with upper trap overuse      AROM   Overall AROM Comments cervical rotation B 4 5 deg, seated ( symmetrical post Tx)       Palpation   Spinal mobility tightness at medial scapula, intercostals, hypomobile T 7-10 L , teres minor, SA                           [facility] Adult PT Treatment/Exercise - 07/22/21 1804       Neuro Re-ed    Neuro Re-ed Details  cued for HEP to mobilize L scapula and cervical spine      Manual Therapy   Manual therapy comments STM/MWM at probelm areas noted in assessment                         PT Long Term Goals - 07/15/21 1508       PT LONG TERM GOAL #1   Title Pt will be able to sit for 30 min on a hard surface in order to prepare to return to church and sit through a whole service    Baseline no able to sit 30 min  ( 3/15: able to sit 30- 45 min )    Time 8    Period Weeks    Status Achieved      PT LONG TERM  GOAL #2   Title Pt will report no  numbness along  B thighs above knee after standing for long periods 30-40 min in order to perform ADLs    Baseline numbness along  B thighs above knee after standing for long periods 30-40 min    Time 8    Period Weeks    Status Achieved      PT LONG TERM GOAL #3   Title Pt will be IND with car seat modifications in order to ride in variable car seat s in order to engage in voluteer activities and support wife who depends on him for driving    Baseline not IND    Time 4    Period Weeks    Status Achieved      PT LONG TERM GOAL #4   Title Pt will decrease his FOTO pain score from 50 pts to < 25 pts in order to improve QOL (02/25/21: 38 pts, 06/10/21: 21 pts)    Baseline 50pts    Time 10    Period Weeks    Status Achieved      PT LONG TERM GOAL #5   Title Pt will demo equal pelvic girdle height and less spinal deviations, and more reciprocal gait pattern    Baseline L illiac crest higher,  limited R anterior pelvic rotation with swing phase    Time 2    Period Weeks    Status Achieved      Additional Long Term Goals   Additional Long Term Goals --      PT LONG TERM GOAL #6   Title Pt will  demo increased strength on RLE from 3+/5 to > 4/5 hip flexion, hip abd, hip ext, and increased hip ext from 10 deg to > 20 deg ( L 20 deg) ( tested in sidelying) in order to walk 10K steps with wife    Baseline 3+/5 hip flexion, hip abd, hip ext    Time 6    Period Weeks    Status On-going      PT LONG TERM GOAL #7   Title Flare ups with pain decrease from   3-4 x a week  to < 2-3 x week    Time 6    Period Weeks    Status Achieved      PT LONG TERM GOAL #8   Title Pt will be able to sit on a chair at home  for 60 min in order to return to church services    Time 4    Period Weeks    Status On-going      PT LONG TERM GOAL  #9   TITLE Pt will demo no cues for backward stepping/ lunges in order to improve propioception    Time 2    Period Weeks    Status On-going      PT LONG TERM GOAL  #10   TITLE Pt will demo increased R hip ext from from 10 deg to > 15 deg,  improved B SLS without UE support up to > 5 sec in order to improve balance    Baseline R hip ext 10 deg, L hip ext 20 deg and difficulty with B SLS without UE support  07/15/21.    Time 10    Period Weeks    Status New    Target Date 09/23/21      PT LONG TERM GOAL  #11   TITLE Pt wil demo no hypomobility at S2-3  level at L SIJ in order to minimize pain in the area where he had a fall onto back in Jan 2022 and no pain in this area with band and strap HEP    Time 8    Period Weeks    Status New    Target Date 09/09/21      PT LONG TERM GOAL  #12   TITLE Pt will demo no more tensions at mm attachments of L occiput and mandible across 2 sessions in order to gain ROM when driving.    Time 10    Period Weeks    Status New    Target Date 09/23/21                   Plan - 07/22/21 1802     Clinical Impression Statement Pt remains dedicated to relaxation practice which he feels have been helpful to his pain management.  Pt's cervical rotation and sideflexion showed good carry over from last Tx.  Pt 's L upper  quadrant was addressed with manual Tx after which pt demo'd more symmetrical movement of scapula and L one was no longer delayed. Plan to continue with cervical/ scapular retraction strengthening and coordination training at upcoming sessions to help with better head turns when driving.   Plan to add and progress lower kinetic chain training at upcoming sessions as well. Pt continues to benefit fromskilled PT   Examination-Activity Limitations Stand;Sit    Rehab Potential Good    PT Frequency 1x / week    PT Duration Other (comment)   10   PT Treatment/Interventions ADLs/Self Care Home Management;Stair training;Therapeutic activities;Therapeutic exercise;Moist Heat;Neuromuscular re-education;Balance training;Scar mobilization;Manual techniques;Functional mobility training;Traction;Cryotherapy;Dry needling;Patient/family education;Compression bandaging;Taping;Splinting;Gait training;Energy conservation;Spinal Manipulations;Joint Manipulations    Consulted and Agree with Plan of Care Patient             Patient will benefit from skilled therapeutic intervention in order to improve the following deficits and impairments:  Hypomobility, Decreased strength, Decreased range of motion, Decreased endurance, Increased muscle spasms, Decreased scar mobility, Decreased mobility, Decreased coordination, Abnormal gait, Decreased activity tolerance, Improper body mechanics, Pain, Postural dysfunction  Visit Diagnosis: Other abnormalities of gait and mobility  Sacrococcygeal disorders, not elsewhere classified     Problem List There are no problems to display for this patient.   Jerl Mina ,PT, DPT, E-RYT  07/22/2021, 6:09 PM  [place] MAIN [facility] SERVICES [address] [place], [place], [address] Phone: [phone]   Fax:  [phone]  Name: Alejandro Williamson MRN: [id number] Date of Birth: Apr 22, 1951

## 2021-08-07 ENCOUNTER — Other Ambulatory Visit: Payer: Self-pay

## 2021-08-07 ENCOUNTER — Ambulatory Visit: Payer: Medicare HMO | Admitting: Physical Therapy

## 2021-08-07 DIAGNOSIS — R2689 Other abnormalities of gait and mobility: Secondary | ICD-10-CM

## 2021-08-07 DIAGNOSIS — M2170 Unequal limb length (acquired), unspecified site: Secondary | ICD-10-CM

## 2021-08-07 DIAGNOSIS — M533 Sacrococcygeal disorders, not elsewhere classified: Secondary | ICD-10-CM | POA: Diagnosis not present

## 2021-08-07 NOTE — Therapy (Signed)
[place] MAIN [facility] SERVICES [address] [place], [place], [address] Phone: [phone]   Fax:  [phone]  Physical Therapy Treatment  Patient Details  Name: Alejandro Williamson MRN: [id number] Date of Birth: 07/20/1951 Referring Provider (PT): Candelaria Stagers MD   Encounter Date: 08/07/2021   PT End of Session - 08/07/21 1650     Visit Number 22    Number of Visits --   eval 01/28/21   Date for PT Re-Evaluation 08/19/21   PN 03/27/21, 06/10/21, 07/15/21   PT Start Time 1600    PT Stop Time 1700    PT Time Calculation (min) 60 min    Activity Tolerance Patient tolerated treatment well    Behavior During Therapy [facility] for tasks assessed/performed             Past Medical History:  Diagnosis Date   Basal cell carcinoma 04/09/2009, 11/18/2009   right med forehead 3cm sup to right medial brow    No past surgical history on file.  There were no vitals filed for this visit.   Subjective Assessment - 08/07/21 1606     Subjective Pt reported he has noticed 3-4 fairly good days when he notices his pain is there but it is tolerable. Then there are 3 days aweek where the pain is back at baseline. 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 The pain is also coming back on the weekends when he is driving more to places. Pt also has noticed he over did the feet arch exercises by doing them barefoot and the arch and the outside of the  L foot. Most often the pain is the scrotal pain and the pain at the glut from his fall back in January is almost gone.    Patient Stated Goals be able to drive to [facility] and see grandkids                [facility] PT Assessment - 08/07/21 1652       Coordination   Coordination and Movement Description limited pinky toe extension in CKC      Palpation   SI assessment  in child pose, reproduced pain with palpation along ischial rami L     Palpation comment tightness alongL  adductor hallucis transverse mm head, dorsum instrinsic mm                           [facility] Adult PT Treatment/Exercise - 08/07/21 1653       Neuro Re-ed    Neuro Re-ed Details  cued for toe extension and SLS knee propioception and transverse arch co-activation      Moist Heat Therapy   Number Minutes Moist Heat 5 Minutes    Moist Heat Location --   sacrum     Manual Therapy   Manual therapy comments STM/MWM at probelm areas noted in assessment                         PT Long Term Goals - 07/15/21 1508       PT LONG TERM GOAL #1   Title Pt will be able to sit for 30 min on a hard surface in order to prepare to return to church and sit through a whole service    Baseline no able to sit 30 min  (  3/15: able to sit 30- 45 min )    Time 8    Period Weeks    Status Achieved      PT LONG TERM GOAL #2   Title Pt will report no  numbness along  B thighs above knee after standing for long periods 30-40 min in order to perform ADLs    Baseline numbness along  B thighs above knee after standing for long periods 30-40 min    Time 8    Period Weeks    Status Achieved      PT LONG TERM GOAL #3   Title Pt will be IND with car seat modifications in order to ride in variable car seat s in order to engage in voluteer activities and support wife who depends on him for driving    Baseline not IND    Time 4    Period Weeks    Status Achieved      PT LONG TERM GOAL #4   Title Pt will decrease his FOTO pain score from 50 pts to < 25 pts in order to improve QOL (02/25/21: 38 pts, 06/10/21: 21 pts)    Baseline 50pts    Time 10    Period Weeks    Status Achieved      PT LONG TERM GOAL #5   Title Pt will demo equal pelvic girdle height and less spinal deviations, and more reciprocal gait pattern    Baseline L illiac crest higher,  limited R anterior pelvic rotation with swing phase    Time 2    Period Weeks    Status Achieved       Additional Long Term Goals   Additional Long Term Goals --      PT LONG TERM GOAL #6   Title Pt will demo increased strength on RLE from 3+/5 to > 4/5 hip flexion, hip abd, hip ext, and increased hip ext from 10 deg to > 20 deg ( L 20 deg) ( tested in sidelying) in order to walk 10K steps with wife    Baseline 3+/5 hip flexion, hip abd, hip ext    Time 6    Period Weeks    Status On-going      PT LONG TERM GOAL #7   Title Flare ups with pain decrease from   3-4 x a week  to < 2-3 x week    Time 6    Period Weeks    Status Achieved      PT LONG TERM GOAL #8   Title Pt will be able to sit on a chair at home  for 60 min in order to return to church services    Time 4    Period Weeks    Status On-going      PT LONG TERM GOAL  #9   TITLE Pt will demo no cues for backward stepping/ lunges in order to improve propioception    Time 2    Period Weeks    Status On-going      PT LONG TERM GOAL  #10   TITLE Pt will demo increased R hip ext from from 10 deg to > 15 deg,  improved B SLS without UE support up to > 5 sec in order to improve balance    Baseline R hip ext 10 deg, L hip ext 20 deg and difficulty with B SLS without UE support  07/15/21.    Time 10    Period Weeks  Status New    Target Date 09/23/21      PT LONG TERM GOAL  #11   TITLE Pt wil demo no hypomobility at S2-3 level at L SIJ in order to minimize pain in the area where he had a fall onto back in Jan 2022 and no pain in this area with band and strap HEP    Time 8    Period Weeks    Status New    Target Date 09/09/21      PT LONG TERM GOAL  #12   TITLE Pt will demo no more tensions at mm attachments of L occiput and mandible across 2 sessions in order to gain ROM when driving.    Time 10    Period Weeks    Status New    Target Date 09/23/21                   Plan - 08/07/21 1651     Clinical Impression Statement Pt demo'd increased tightness at ischial rami mm attachments and L foot instrinsic  foot mm. Pt demo'd decreased tightness post Tx. Added propioception of knee alignment and toe extension. Pt continues to benefit from skilled PT.    Examination-Activity Limitations Stand;Sit    Rehab Potential Good    PT Frequency 1x / week    PT Duration Other (comment)   10   PT Treatment/Interventions ADLs/Self Care Home Management;Stair training;Therapeutic activities;Therapeutic exercise;Moist Heat;Neuromuscular re-education;Balance training;Scar mobilization;Manual techniques;Functional mobility training;Traction;Cryotherapy;Dry needling;Patient/family education;Compression bandaging;Taping;Splinting;Gait training;Energy conservation;Spinal Manipulations;Joint Manipulations    Consulted and Agree with Plan of Care Patient             Patient will benefit from skilled therapeutic intervention in order to improve the following deficits and impairments:  Hypomobility, Decreased strength, Decreased range of motion, Decreased endurance, Increased muscle spasms, Decreased scar mobility, Decreased mobility, Decreased coordination, Abnormal gait, Decreased activity tolerance, Improper body mechanics, Pain, Postural dysfunction  Visit Diagnosis: Sacrococcygeal disorders, not elsewhere classified  Unequal leg length  Other abnormalities of gait and mobility     Problem List There are no problems to display for this patient.   Jerl Mina ,PT, DPT, E-RYT  08/07/2021, 5:03 PM  [place] MAIN [facility] SERVICES [address] [place], [place], [address] Phone: [phone]   Fax:  [phone]  Name: Alejandro Williamson MRN: [id number] Date of Birth: 02/16/1951

## 2021-08-07 NOTE — Patient Instructions (Addendum)
      Letter T, seeasaw on one leg with band band under L foot, wrap by big toe then, outer knee/ thigh, L hand pulling ( elbow by side)  Plant ballmound, toes spread, thigh out against band,, tracking knee in line with 2-3rd toe line Dipping forward, R foot lifts slight ( whole body like a see saw) off floor or  Press back foot against wall 20 reps  L only   __     Frog stretch: laying on belly with pillow under hips, knees bent, inhale do nothing, exhale let ankles fall apart  45 deg on exhale   ___  Shoes on with feet exercise to minimize arch pain  ___  Child s pose against wall with folded towel to press pinky ballmound against   __

## 2021-08-13 ENCOUNTER — Ambulatory Visit: Payer: Medicare HMO | Admitting: Dermatology

## 2021-08-13 ENCOUNTER — Other Ambulatory Visit: Payer: Self-pay

## 2021-08-13 DIAGNOSIS — D692 Other nonthrombocytopenic purpura: Secondary | ICD-10-CM

## 2021-08-13 DIAGNOSIS — L918 Other hypertrophic disorders of the skin: Secondary | ICD-10-CM

## 2021-08-13 DIAGNOSIS — L814 Other melanin hyperpigmentation: Secondary | ICD-10-CM

## 2021-08-13 DIAGNOSIS — Z1283 Encounter for screening for malignant neoplasm of skin: Secondary | ICD-10-CM | POA: Diagnosis not present

## 2021-08-13 DIAGNOSIS — L821 Other seborrheic keratosis: Secondary | ICD-10-CM

## 2021-08-13 DIAGNOSIS — L578 Other skin changes due to chronic exposure to nonionizing radiation: Secondary | ICD-10-CM

## 2021-08-13 DIAGNOSIS — L820 Inflamed seborrheic keratosis: Secondary | ICD-10-CM | POA: Diagnosis not present

## 2021-08-13 DIAGNOSIS — Z85828 Personal history of other malignant neoplasm of skin: Secondary | ICD-10-CM | POA: Diagnosis not present

## 2021-08-13 DIAGNOSIS — L570 Actinic keratosis: Secondary | ICD-10-CM | POA: Diagnosis not present

## 2021-08-13 DIAGNOSIS — D229 Melanocytic nevi, unspecified: Secondary | ICD-10-CM

## 2021-08-13 DIAGNOSIS — D1800 Hemangioma unspecified site: Secondary | ICD-10-CM

## 2021-08-13 NOTE — Patient Instructions (Signed)

## 2021-08-13 NOTE — Progress Notes (Signed)
Follow-Up Visit   Subjective  Alejandro Williamson is a 70 y.o. male who presents for the following: Annual Exam (Hx BCC - patient has noticed no new or changing skin lesions other than purpura). The patient presents for Total-Body Skin Exam (TBSE) for skin cancer screening and mole check.  The following portions of the chart were reviewed this encounter and updated as appropriate:   Allergies  Meds  Problems  Med Hx  Surg Hx  Fam Hx     Review of Systems:  No other skin or systemic complaints except as noted in HPI or Assessment and Plan.  Objective  Well appearing patient in no apparent distress; mood and affect are within normal limits.  A full examination was performed including scalp, head, eyes, ears, nose, lips, neck, chest, axillae, abdomen, back, buttocks, bilateral upper extremities, bilateral lower extremities, hands, feet, fingers, toes, fingernails, and toenails. All findings within normal limits unless otherwise noted below.   Assessment & Plan  History of basal cell carcinoma (BCC) right med forehead 3cm sup to right medial brow Clear. Observe for recurrence. Call clinic for new or changing lesions.  Recommend regular skin exams, daily broad-spectrum spf 30+ sunscreen use, and photoprotection.    AK (actinic keratosis) R crown/scalp x 1 RTC if not resolved in 6 weeks.  Destruction of lesion - R crown/scalp x 1 Complexity: simple   Destruction method: cryotherapy   Informed consent: discussed and consent obtained   Timeout:  patient name, date of birth, surgical site, and procedure verified Lesion destroyed using liquid nitrogen: Yes   Region frozen until ice ball extended beyond lesion: Yes   Outcome: patient tolerated procedure well with no complications   Post-procedure details: wound care instructions given    Inflamed seborrheic keratosis L temple/hairline x 1 Destruction of lesion - L temple/hairline x 1 Complexity: simple   Destruction method: cryotherapy    Informed consent: discussed and consent obtained   Timeout:  patient name, date of birth, surgical site, and procedure verified Lesion destroyed using liquid nitrogen: Yes   Region frozen until ice ball extended beyond lesion: Yes   Outcome: patient tolerated procedure well with no complications   Post-procedure details: wound care instructions given    Skin cancer screening  Lentigines - Scattered tan macules - Due to sun exposure - Benign-appering, observe - Recommend daily broad spectrum sunscreen SPF 30+ to sun-exposed areas, reapply every 2 hours as needed. - Call for any changes  Seborrheic Keratoses - Stuck-on, waxy, tan-brown papules and/or plaques  - Benign-appearing - Discussed benign etiology and prognosis. - Observe - Call for any changes  Melanocytic Nevi - Tan-brown and/or pink-flesh-colored symmetric macules and papules - Benign appearing on exam today - Observation - Call clinic for new or changing moles - Recommend daily use of broad spectrum spf 30+ sunscreen to sun-exposed areas.   Hemangiomas - Red papules - Discussed benign nature - Observe - Call for any changes  Actinic Damage - Chronic condition, secondary to cumulative UV/sun exposure - diffuse scaly erythematous macules with underlying dyspigmentation - Recommend daily broad spectrum sunscreen SPF 30+ to sun-exposed areas, reapply every 2 hours as needed.  - Staying in the shade or wearing long sleeves, sun glasses (UVA+UVB protection) and wide brim hats (4-inch brim around the entire circumference of the hat) are also recommended for sun protection.  - Call for new or changing lesions.  Acrochordons (Skin Tags) - Fleshy, skin-colored pedunculated papules - Benign appearing.  - Observe. - If  desired, they can be removed with an in office procedure that is not covered by insurance. - Please call the clinic if you notice any new or changing lesions.  Purpura - Chronic; persistent and  recurrent.  Treatable, but not curable. - Violaceous macules and patches - Benign - Related to trauma, age, sun damage and/or use of blood thinners, chronic use of topical and/or oral steroids - Observe - Can use OTC arnica containing moisturizer such as Dermend Bruise Formula if desired - Call for worsening or other concerns  Skin cancer screening performed today.  Return in about 1 year (around 08/13/2022) for TBSE.  Luther Redo, CMA, am acting as scribe for Sarina Ser, MD . Documentation: I have reviewed the above documentation for accuracy and completeness, and I agree with the above.  Sarina Ser, MD

## 2021-08-14 ENCOUNTER — Encounter: Payer: Self-pay | Admitting: Dermatology

## 2021-08-20 ENCOUNTER — Ambulatory Visit: Payer: Medicare HMO | Admitting: Physical Therapy

## 2021-09-01 ENCOUNTER — Other Ambulatory Visit: Payer: Self-pay

## 2021-09-01 ENCOUNTER — Ambulatory Visit: Payer: Medicare HMO | Attending: Sports Medicine | Admitting: Physical Therapy

## 2021-09-01 DIAGNOSIS — R2689 Other abnormalities of gait and mobility: Secondary | ICD-10-CM | POA: Diagnosis present

## 2021-09-01 DIAGNOSIS — M533 Sacrococcygeal disorders, not elsewhere classified: Secondary | ICD-10-CM | POA: Insufficient documentation

## 2021-09-01 NOTE — Patient Instructions (Signed)
Modifed the teeter totter: - not lift foot up, tap the toe bents on the floor behind you And pay attention to toes lifts, ballmounds wide and knee out along 2nd -3rd line   To avoid foot pain and fallen arches  __

## 2021-09-01 NOTE — Therapy (Signed)
[place] MAIN [facility] SERVICES [address] [place], [place], [address] Phone: [phone]   Fax:  [phone]  Physical Therapy Treatment  Patient Details  Name: Alejandro Williamson MRN: [id number] Date of Birth: 11-09-1951 Referring Provider (PT): Candelaria Stagers MD   Encounter Date: 09/01/2021   PT End of Session - 09/01/21 1512     Visit Number 23    Number of Visits --   eval 01/28/21   Date for PT Re-Evaluation 11/10/21   PN 03/27/21, 06/10/21, 07/15/21   PT Start Time 1503    PT Stop Time 1600    PT Time Calculation (min) 57 min    Activity Tolerance Patient tolerated treatment well    Behavior During Therapy [facility] for tasks assessed/performed             Past Medical History:  Diagnosis Date   Basal cell carcinoma 04/09/2009, 11/18/2009   right med forehead 3cm sup to right medial brow    No past surgical history on file.  There were no vitals filed for this visit.   Subjective Assessment - 09/01/21 1509     Subjective Pt reported L foot started aggravating him. Pt had a couple of really good days. Pt has more issues where he had fell. The area gets tight and stingy and then causes the pudendal area. Pt is seeking consulations for DGR implant for pain . Pt started a consult appt with psychotherapist at the [facility]    Patient Stated Goals be able to drive to [facility] and see grandkids                [facility] PT Assessment - 09/01/21 1859       Observation/Other Assessments   Observations RDL exercise performed incorrectly, medial collapse of L arch with poor alignment of pelvic girdle      Palpation   SI assessment  L SIJ hypomobile near coccyx, tightness along L leg and flexor retinaculum,                           [facility] Adult PT Treatment/Exercise - 09/01/21 1902       Neuro Re-ed    Neuro Re-ed Details  ceud for proper alignment and technique and modified RDL exercise to minimize collapse of medial arch       Manual Therapy   Manual therapy comments STM/MWM at problem areas noted in assessment                          PT Long Term Goals - 09/01/21 1513       PT LONG TERM GOAL #1   Title Pt will be able to sit for 30 min on a hard surface in order to prepare to return to church and sit through a whole service    Baseline no able to sit 30 min  ( 3/15: able to sit 30- 45 min )    Time 8    Period Weeks    Status Achieved      PT LONG TERM GOAL #2   Title Pt will report no  numbness along  B thighs above knee after standing for long periods 30-40 min in order to perform ADLs    Baseline numbness along  B thighs above knee after standing for long periods 30-40 min    Time 8    Period Weeks    Status Achieved  PT LONG TERM GOAL #3   Title Pt will be IND with car seat modifications in order to ride in variable car seat s in order to engage in voluteer activities and support wife who depends on him for driving    Baseline not IND    Time 4    Period Weeks    Status Achieved      PT LONG TERM GOAL #4   Title Pt will decrease his FOTO pain score from 50 pts to < 25 pts in order to improve QOL (02/25/21: 38 pts, 06/10/21: 21 pts)    Baseline 50pts    Time 10    Period Weeks    Status Achieved      PT LONG TERM GOAL #5   Title Pt will demo equal pelvic girdle height and less spinal deviations, and more reciprocal gait pattern    Baseline L illiac crest higher,  limited R anterior pelvic rotation with swing phase    Time 2    Period Weeks    Status Achieved      PT LONG TERM GOAL #6   Title Pt will demo increased strength on RLE from 3+/5 to > 4/5 hip flexion, hip abd, hip ext, and increased hip ext from 10 deg to > 20 deg ( L 20 deg) ( tested in sidelying) in order to walk 10K steps with wife    Baseline 3+/5 hip flexion, hip abd, hip ext  ( 09/01/21: 5/5 hip abd B, 4+/5 hip ext B, hip ext L 20 deg, R 15 deg )    Time 6    Period Weeks    Status Partially Met       PT LONG TERM GOAL #7   Title Flare ups with pain decrease from   3-4 x a week  to < 2-3 x week    Time 6    Period Weeks    Status Achieved      PT LONG TERM GOAL #8   Title Pt will be able to sit on a chair at home  for 60 min in order to return to church services    Baseline 09/01/21: 30 min    Time 4    Period Weeks    Status Partially Met      PT LONG TERM GOAL  #9   TITLE Pt will demo no cues for backward stepping/ lunges in order to improve propioception    Time 2    Period Weeks    Status On-going      PT LONG TERM GOAL  #10   TITLE Pt will demo increased R hip ext from from 10 deg to > 15 deg,  improved B SLS without UE support up to > 5 sec in order to improve balance    Baseline R hip ext 10 deg, L hip ext 20 deg and difficulty with B SLS without UE support  07/15/21.    Time 10    Period Weeks    Status On-going      PT LONG TERM GOAL  #11   TITLE Pt wil demo no hypomobility at S2-3 level at L SIJ in order to minimize pain in the area where he had a fall onto back in Jan 2022 and no pain in this area with band and strap HEP    Time 8    Period Weeks    Status New      PT LONG TERM GOAL  #12  TITLE Pt will demo no more tensions at mm attachments of L occiput and mandible across 2 sessions in order to gain ROM when driving.    Time 10    Period Weeks    Status Achieved                   Plan - 09/01/21 1512     Clinical Impression Statement Pt demo'd hypomobility of L SIJ and tightness of L leg and medial posterior arch. This is likely due to his incorrect form with a past exercise. Pt demo'd improved form and mobility at these areas post Tx. Pt continues to benefit from skilled PT.    Examination-Activity Limitations Stand;Sit    Rehab Potential Good    PT Frequency 1x / week    PT Duration Other (comment)   10   PT Treatment/Interventions ADLs/Self Care Home Management;Stair training;Therapeutic activities;Therapeutic exercise;Moist Heat;Neuromuscular  re-education;Balance training;Scar mobilization;Manual techniques;Functional mobility training;Traction;Cryotherapy;Dry needling;Patient/family education;Compression bandaging;Taping;Splinting;Gait training;Energy conservation;Spinal Manipulations;Joint Manipulations    Consulted and Agree with Plan of Care Patient             Patient will benefit from skilled therapeutic intervention in order to improve the following deficits and impairments:  Hypomobility, Decreased strength, Decreased range of motion, Decreased endurance, Increased muscle spasms, Decreased scar mobility, Decreased mobility, Decreased coordination, Abnormal gait, Decreased activity tolerance, Improper body mechanics, Pain, Postural dysfunction  Visit Diagnosis: Sacrococcygeal disorders, not elsewhere classified  Other abnormalities of gait and mobility     Problem List There are no problems to display for this patient.   Jerl Mina, PT 09/01/2021, 7:03 PM  [place] MAIN [facility] SERVICES [address] [place], [place], [address] Phone: [phone]   Fax:  [phone]  Name: Alejandro Williamson MRN: [id number] Date of Birth: 06/24/1951

## 2021-09-16 ENCOUNTER — Other Ambulatory Visit: Payer: Self-pay | Admitting: Physician Assistant

## 2021-09-16 ENCOUNTER — Other Ambulatory Visit (HOSPITAL_COMMUNITY): Payer: Self-pay | Admitting: Physician Assistant

## 2021-09-16 ENCOUNTER — Ambulatory Visit: Payer: Medicare HMO | Admitting: Physical Therapy

## 2021-09-30 ENCOUNTER — Encounter: Payer: Medicare HMO | Admitting: Physical Therapy

## 2021-09-30 ENCOUNTER — Other Ambulatory Visit: Payer: Self-pay

## 2021-09-30 ENCOUNTER — Ambulatory Visit: Payer: Medicare HMO | Attending: Sports Medicine | Admitting: Physical Therapy

## 2021-09-30 DIAGNOSIS — M533 Sacrococcygeal disorders, not elsewhere classified: Secondary | ICD-10-CM | POA: Insufficient documentation

## 2021-09-30 DIAGNOSIS — R2689 Other abnormalities of gait and mobility: Secondary | ICD-10-CM | POA: Insufficient documentation

## 2021-09-30 DIAGNOSIS — M2170 Unequal limb length (acquired), unspecified site: Secondary | ICD-10-CM | POA: Insufficient documentation

## 2021-09-30 NOTE — Therapy (Signed)
[place] MAIN [facility] SERVICES [address] [place], [place], [address] Phone: [phone]   Fax:  [phone]  Physical Therapy Treatment  Patient Details  Name: Alejandro Williamson MRN: [id number] Date of Birth: [birth year] Referring Provider (PT): Candelaria Stagers MD   Encounter Date: 09/30/2021   PT End of Session - 09/30/21 1410     Visit Number 24    Number of Visits --   eval 01/28/21   Date for PT Re-Evaluation 11/10/21   PN 03/27/21, 06/10/21, 07/15/21   PT Start Time 1406    PT Stop Time 1500    PT Time Calculation (min) 54 min    Activity Tolerance Patient tolerated treatment well    Behavior During Therapy [facility] for tasks assessed/performed             Past Medical History:  Diagnosis Date   Basal cell carcinoma 04/09/2009, 11/18/2009   right med forehead 3cm sup to right medial brow    No past surgical history on file.  There were no vitals filed for this visit.   Subjective Assessment - 09/30/21 1409     Subjective pt reported his son told him that he notices his posture is straighter than he ever has been. Pt is scheduled for a DRG device for pain management to be implanted in the L lumbar region on 10/31/21. Pt notices pain at medial arch of L foot only when following wife at [employer] and after  specific exercise: heel raises, RDL, pleat/ heel raises.    Patient Stated Goals be able to drive to [facility] and see grandkids                [facility] PT Assessment - 09/30/21 1704       Observation/Other Assessments   Observations no upper trap tightness      Coordination   Coordination and Movement Description dyscoordination of pelvic floor      Palpation   Palpation comment L plantar fascia, L lateral leg  tightness,      Ambulation/Gait   Gait Comments short strides, limited hip flexion, fheel striking                        Pelvic Floor Special Questions - 09/30/21 1704     External Perineal Exam through  clothing: tightness of L pelvic floor and R posterior mm               [facility] Adult PT Treatment/Exercise - 09/30/21 1702       Therapeutic Activites    Other Therapeutic Activities discussed the parts to HEP to return to minimize relapse of tightness of pelvic floor and L plantar fascia tightness      Neuro Re-ed    Neuro Re-ed Details  cued for proper technique for deep core to minimize upward movement of pelvic floor, adductors on inhalation,      Manual Therapy   Manual therapy comments STM/MWM at problem areas noted in assessment                          PT Long Term Goals - 09/01/21 1513       PT LONG TERM GOAL #1   Title Pt will be able to sit for 30 min on a hard surface in order to prepare to return to church and sit through a whole service    Baseline no able to sit 30 min  (  3/15: able to sit 30- 45 min )    Time 8    Period Weeks    Status Achieved      PT LONG TERM GOAL #2   Title Pt will report no  numbness along  B thighs above knee after standing for long periods 30-40 min in order to perform ADLs    Baseline numbness along  B thighs above knee after standing for long periods 30-40 min    Time 8    Period Weeks    Status Achieved      PT LONG TERM GOAL #3   Title Pt will be IND with car seat modifications in order to ride in variable car seat s in order to engage in voluteer activities and support wife who depends on him for driving    Baseline not IND    Time 4    Period Weeks    Status Achieved      PT LONG TERM GOAL #4   Title Pt will decrease his FOTO pain score from 50 pts to < 25 pts in order to improve QOL (02/25/21: 38 pts, 06/10/21: 21 pts)    Baseline 50pts    Time 10    Period Weeks    Status Achieved      PT LONG TERM GOAL #5   Title Pt will demo equal pelvic girdle height and less spinal deviations, and more reciprocal gait pattern    Baseline L illiac crest higher,  limited R anterior pelvic rotation with swing phase     Time 2    Period Weeks    Status Achieved      PT LONG TERM GOAL #6   Title Pt will demo increased strength on RLE from 3+/5 to > 4/5 hip flexion, hip abd, hip ext, and increased hip ext from 10 deg to > 20 deg ( L 20 deg) ( tested in sidelying) in order to walk 10K steps with wife    Baseline 3+/5 hip flexion, hip abd, hip ext  ( 09/01/21: 5/5 hip abd B, 4+/5 hip ext B, hip ext L 20 deg, R 15 deg )    Time 6    Period Weeks    Status Partially Met      PT LONG TERM GOAL #7   Title Flare ups with pain decrease from   3-4 x a week  to < 2-3 x week    Time 6    Period Weeks    Status Achieved      PT LONG TERM GOAL #8   Title Pt will be able to sit on a chair at home  for 60 min in order to return to church services    Baseline 09/01/21: 30 min    Time 4    Period Weeks    Status Partially Met      PT LONG TERM GOAL  #9   TITLE Pt will demo no cues for backward stepping/ lunges in order to improve propioception    Time 2    Period Weeks    Status On-going      PT LONG TERM GOAL  #10   TITLE Pt will demo increased R hip ext from from 10 deg to > 15 deg,  improved B SLS without UE support up to > 5 sec in order to improve balance    Baseline R hip ext 10 deg, L hip ext 20 deg and difficulty with B SLS without UE support  07/15/21.    Time 10    Period Weeks    Status On-going      PT LONG TERM GOAL  #11   TITLE Pt wil demo no hypomobility at S2-3 level at L SIJ in order to minimize pain in the area where he had a fall onto back in Jan 2022 and no pain in this area with band and strap HEP    Time 8    Period Weeks    Status New      PT LONG TERM GOAL  #12   TITLE Pt will demo no more tensions at mm attachments of L occiput and mandible across 2 sessions in order to gain ROM when driving.    Time 10    Period Weeks    Status Achieved                   Plan - 09/30/21 1410     Clinical Impression Statement Pt returned to PT with some tightness of pelvic floor and  required review of proper technique for deep core coordination as pt showed improper technique which is likely associated with increased mm tightness bilaterally with L > R. Pt showed overuse of adductors and activation of pelvic floor mm on inhalation but demo'd correct technique post training. Pt also showed increased tightness of plantar fascia and lateral leg mm on L which is associated with his c/o pain when walking at [employer] behind his wife and after performing plantar exercises.  Pt demo'd less engaged gait mechanics when simulating the way he walked at [employer]. Pt was provided manual Tx to minimize tightness of lower kinetic chain and provided cues for more longer strides, increased hip flexion in gait. Pt did show improvement with higher arches and less medial collapse of his medial arches.  Plan to consolidate pt's HEP to maintain the improvements he had gained prior in these areas where he has relapsed as pt has stopped performing certain HEP over the past weeks.   Pt will be undergoing DRG procedure 11/18 and will have restrictions to activities for 12 weeks. Plan to consolidate HEP to help pt pre and post procedure.   Pt continues to  benefit from skilled PT.     Examination-Activity Limitations Stand;Sit    Rehab Potential Good    PT Frequency 1x / week    PT Duration Other (comment)   10   PT Treatment/Interventions ADLs/Self Care Home Management;Stair training;Therapeutic activities;Therapeutic exercise;Moist Heat;Neuromuscular re-education;Balance training;Scar mobilization;Manual techniques;Functional mobility training;Traction;Cryotherapy;Dry needling;Patient/family education;Compression bandaging;Taping;Splinting;Gait training;Energy conservation;Spinal Manipulations;Joint Manipulations    Consulted and Agree with Plan of Care Patient             Patient will benefit from skilled therapeutic intervention in order to improve the following deficits and impairments:  Hypomobility,  Decreased strength, Decreased range of motion, Decreased endurance, Increased muscle spasms, Decreased scar mobility, Decreased mobility, Decreased coordination, Abnormal gait, Decreased activity tolerance, Improper body mechanics, Pain, Postural dysfunction  Visit Diagnosis: Sacrococcygeal disorders, not elsewhere classified  Other abnormalities of gait and mobility  Unequal leg length     Problem List There are no problems to display for this patient.   Jerl Mina, PT 09/30/2021, 5:52 PM  [place] MAIN [facility] SERVICES [address] [place], [place], [address] Phone: [phone]   Fax:  [phone]  Name: Alejandro Williamson MRN: [id number] Date of Birth: [birth year]

## 2021-09-30 NOTE — Patient Instructions (Addendum)
   Deep core level 1-2  ( handout)     Walking with higher knees, at your own pace     Leg stretches with strap    Heel raises, heel together,

## 2021-10-03 ENCOUNTER — Other Ambulatory Visit: Payer: Self-pay | Admitting: Physician Assistant

## 2021-10-03 DIAGNOSIS — G8929 Other chronic pain: Secondary | ICD-10-CM

## 2021-10-03 DIAGNOSIS — M7918 Myalgia, other site: Secondary | ICD-10-CM

## 2021-10-03 DIAGNOSIS — G588 Other specified mononeuropathies: Secondary | ICD-10-CM

## 2021-10-03 DIAGNOSIS — G5702 Lesion of sciatic nerve, left lower limb: Secondary | ICD-10-CM

## 2021-10-08 ENCOUNTER — Ambulatory Visit: Payer: Medicare HMO | Admitting: Physical Therapy

## 2021-10-08 ENCOUNTER — Other Ambulatory Visit: Payer: Self-pay

## 2021-10-08 DIAGNOSIS — R2689 Other abnormalities of gait and mobility: Secondary | ICD-10-CM | POA: Diagnosis present

## 2021-10-08 DIAGNOSIS — M2170 Unequal limb length (acquired), unspecified site: Secondary | ICD-10-CM

## 2021-10-08 DIAGNOSIS — M533 Sacrococcygeal disorders, not elsewhere classified: Secondary | ICD-10-CM

## 2021-10-09 NOTE — Therapy (Signed)
[place] MAIN [facility] SERVICES [address] [place], [place], [address] Phone: [phone]   Fax:  [phone]  Physical Therapy Treatment / Discharge Summary from 01/28/21 to 10/08/21 across 25 visits   Patient Details  Name: Alejandro Williamson MRN: [id number] Date of Birth: 1951/02/04 Referring Provider (PT): Candelaria Stagers MD   Encounter Date: 10/08/2021   PT End of Session - 10/08/21 1347     Visit Number 25    Number of Visits --   eval 01/28/21   Date for PT Re-Evaluation 11/10/21   PN 03/27/21, 06/10/21, 07/15/21   PT Start Time 1300    PT Stop Time 1350    PT Time Calculation (min) 50 min    Activity Tolerance Patient tolerated treatment well    Behavior During Therapy [facility] for tasks assessed/performed             Past Medical History:  Diagnosis Date   Basal cell carcinoma 04/09/2009, 11/18/2009   right med forehead 3cm sup to right medial brow    No past surgical history on file.  There were no vitals filed for this visit.   Subjective Assessment - 10/08/21 1349     Subjective Pt has had consistently 2 good days across each week for the past month. Good days mean pain is at 4/10 instead 7/10 and able to sit through restaurants on hard chairs but has not tried to return to church and sit on a pew through a whole sermon.  Pt is able to walk and do his plantar flexion exercises without foot pain since last session.                [facility] PT Assessment - 10/09/21 1743       Observation/Other Assessments   Observations higher medial arches      Palpation   Palpation comment hypomobile L midfoot                           [facility] Adult PT Treatment/Exercise - 10/09/21 1744       Therapeutic Activites    Other Therapeutic Activities reviewed past HEP, categories them, discussed which ones to miantain and to ask provider who will be placing DRG implant that he can perform during the restricted activty period of 12  week      Neuro Re-ed    Neuro Re-ed Details  cued for past exercises technique      Manual Therapy   Manual therapy comments AP/PA mob at rays to mobilize mid foot to promote Toe ab and EV                          PT Long Term Goals - 10/08/21 1356       PT LONG TERM GOAL #1   Title Pt will be able to sit for 30 min on a hard surface in order to prepare to return to church and sit through a whole service    Baseline no able to sit 30 min  ( 3/15: able to sit 30- 45 min )    Time 8    Period Weeks    Status Achieved      PT LONG TERM GOAL #2   Title Pt will report no  numbness along  B thighs above knee after standing for long periods 30-40 min in order to perform ADLs    Baseline numbness along  B thighs above knee after standing for long periods 30-40 min    Time 8    Period Weeks    Status Achieved      PT LONG TERM GOAL #3   Title Pt will be IND with car seat modifications in order to ride in variable car seat s in order to engage in voluteer activities and support wife who depends on him for driving    Baseline not IND    Time 4    Period Weeks    Status Achieved      PT LONG TERM GOAL #4   Title Pt will decrease his FOTO pain score from 50 pts to < 25 pts in order to improve QOL (02/25/21: 38 pts, 06/10/21: 21 pts, 10/08/21: 33 pts )    Baseline 50pts    Time 10    Period Weeks    Status Achieved      PT LONG TERM GOAL #5   Title Pt will demo equal pelvic girdle height and less spinal deviations, and more reciprocal gait pattern    Baseline L illiac crest higher,  limited R anterior pelvic rotation with swing phase    Time 2    Period Weeks    Status Achieved      PT LONG TERM GOAL #6   Title Pt will demo increased strength on RLE from 3+/5 to > 4/5 hip flexion, hip abd, hip ext, and increased hip ext from 10 deg to > 20 deg ( L 20 deg) ( tested in sidelying) in order to walk 10K steps with wife    Baseline 3+/5 hip flexion, hip abd, hip ext  (  09/01/21: 5/5 hip abd B, 4+/5 hip ext B, hip ext L 20 deg, R 15 deg ) (10/08/21:    4-/5 hip flexion, hip ext L 20 deg, R 15 deg )    Time 6    Period Weeks    Status Achieved      PT LONG TERM GOAL #7   Title Flare ups with pain decrease from   3-4 x a week  to < 2-3 x week    Time 6    Period Weeks    Status Achieved      PT LONG TERM GOAL #8   Title Pt will be able to sit on a chair at home  for 60 min in order to return to church services    Baseline 09/01/21: 30 min    Time 4    Period Weeks    Status Partially Met      PT LONG TERM GOAL  #9   TITLE Pt will demo no cues for backward stepping/ lunges in order to improve propioception    Time 2    Period Weeks    Status Achieved      PT LONG TERM GOAL  #10   TITLE Pt will demo increased R hip ext from from 10 deg to > 15 deg,  improved B SLS without UE support up to > 5 sec in order to improve balance    Baseline R hip ext 10 deg, L hip ext 20 deg and difficulty with B SLS without UE support  07/15/21.  ( 10/08/21: 15 deg, 3 sec with SLS without UE support)    Time 10    Period Weeks    Status Partially Met      PT LONG TERM GOAL  #11   TITLE Pt wil demo no  hypomobility at S2-3 level at L SIJ in order to minimize pain in the area where he had a fall onto back in Jan 2022 and no pain in this area with band and strap HEP    Time 8    Period Weeks    Status Achieved      PT LONG TERM GOAL  #12   TITLE Pt will demo no more tensions at mm attachments of L occiput and mandible across 2 sessions in order to gain ROM when driving.    Time 10    Period Weeks    Status Achieved                   Plan - 10/09/21 1750     Clinical Impression Statement Pt has achieved 10/12 goals across 25 visits from 01/28/21 to 10/08/21.   FOTO score decreased from 50pts to 25 pts in June   but was 33 pts in Oct. This indicates improved pain from Feb to June as his posture/ spine/ coccyx deviations were addressed with manual Tx, body  mechanics training, and neuromuscular training/ strengthening/ stretching HEP. Pt has responded greatly to biopsychosocial approaches with relaxation practices as stress was a trigger to his pain. Pt also is seeking psychotherapy through a [place] program. Lower kinetic chain deficits were also addressed to improve medial arch and intrinsic feet/ plantar flexion strength / SLS balance.   Currently, pt feels he has reached a plateau and is seeking DRG implant on 10/31/21 for pain management.   Pt has had consistently 2 good days across each week for the past month. Good days is defined as pain is at 4/10 instead 7/10 and able to sit through restaurants on hard chairs. Pt is not tried to return to church and sit on a pew through a whole sermon. Pt is able to walk and do his plantar flexion exercises without foot pain since last session.   Discussed which HEP to maintain and which ones to ask his provider who is performing the implant that he can perform during his restricted activity period for 12 weeks post-op.   Pt is ready for d/c at this time.     Examination-Activity Limitations Stand;Sit    Stability/Clinical Decision Making Evolving/Moderate complexity    Clinical Decision Making Moderate    Rehab Potential Good    PT Frequency 1x / week    PT Duration Other (comment)   10   PT Treatment/Interventions ADLs/Self Care Home Management;Stair training;Therapeutic activities;Therapeutic exercise;Moist Heat;Neuromuscular re-education;Balance training;Scar mobilization;Manual techniques;Functional mobility training;Traction;Cryotherapy;Dry needling;Patient/family education;Compression bandaging;Taping;Splinting;Gait training;Energy conservation;Spinal Manipulations;Joint Manipulations    Consulted and Agree with Plan of Care Patient             Patient will benefit from skilled therapeutic intervention in order to improve the following deficits and impairments:  Hypomobility, Decreased strength,  Decreased range of motion, Decreased endurance, Increased muscle spasms, Decreased scar mobility, Decreased mobility, Decreased coordination, Abnormal gait, Decreased activity tolerance, Improper body mechanics, Pain, Postural dysfunction  Visit Diagnosis: Sacrococcygeal disorders, not elsewhere classified  Other abnormalities of gait and mobility  Unequal leg length     Problem List There are no problems to display for this patient.   Jerl Mina, PT 10/09/2021, 5:53 PM  [place] MAIN [facility] SERVICES [address] [place], [place], [address] Phone: [phone]   Fax:  [phone]  Name: Alejandro Williamson MRN: [id number] Date of Birth: Jul 29, 1951

## 2021-10-14 ENCOUNTER — Encounter: Payer: Medicare HMO | Admitting: Physical Therapy

## 2021-10-16 ENCOUNTER — Ambulatory Visit: Payer: Medicare HMO

## 2021-10-23 ENCOUNTER — Ambulatory Visit
Admission: RE | Admit: 2021-10-23 | Discharge: 2021-10-23 | Disposition: A | Discharge status: Home / Self Care | Payer: Medicare HMO | Source: Ambulatory Visit | Attending: Physician Assistant | Admitting: Physician Assistant

## 2021-10-23 ENCOUNTER — Other Ambulatory Visit: Payer: Self-pay

## 2021-10-23 DIAGNOSIS — G588 Other specified mononeuropathies: Secondary | ICD-10-CM | POA: Diagnosis present

## 2021-10-23 DIAGNOSIS — G8929 Other chronic pain: Secondary | ICD-10-CM | POA: Diagnosis present

## 2021-10-23 DIAGNOSIS — G5702 Lesion of sciatic nerve, left lower limb: Secondary | ICD-10-CM | POA: Insufficient documentation

## 2021-10-23 DIAGNOSIS — M7918 Myalgia, other site: Secondary | ICD-10-CM | POA: Diagnosis present

## 2021-10-28 ENCOUNTER — Encounter: Payer: Medicare HMO | Admitting: Physical Therapy

## 2021-11-11 ENCOUNTER — Encounter: Payer: Medicare HMO | Admitting: Physical Therapy

## 2021-11-25 ENCOUNTER — Encounter: Payer: Medicare HMO | Admitting: Physical Therapy

## 2021-12-09 ENCOUNTER — Encounter: Payer: Medicare HMO | Admitting: Physical Therapy

## 2022-01-26 ENCOUNTER — Other Ambulatory Visit: Payer: Self-pay

## 2022-01-26 ENCOUNTER — Ambulatory Visit: Payer: Medicare HMO | Admitting: Dermatology

## 2022-01-26 DIAGNOSIS — L820 Inflamed seborrheic keratosis: Secondary | ICD-10-CM

## 2022-01-26 DIAGNOSIS — L821 Other seborrheic keratosis: Secondary | ICD-10-CM

## 2022-01-26 DIAGNOSIS — L578 Other skin changes due to chronic exposure to nonionizing radiation: Secondary | ICD-10-CM

## 2022-01-26 NOTE — Patient Instructions (Signed)

## 2022-01-26 NOTE — Progress Notes (Signed)
° °  Follow-Up Visit   Subjective  Marcelo Ickes is a 71 y.o. male who presents for the following: Irregular skin lesion (On the L scalp - hair dresser hit it cutting his hair. ). The patient has spots, moles and lesions to be evaluated, some may be new or changing.  The following portions of the chart were reviewed this encounter and updated as appropriate:   Allergies   Meds   Problems   Med Hx   Surg Hx   Fam Hx       Review of Systems:  No other skin or systemic complaints except as noted in HPI or Assessment and Plan.  Objective  Well appearing patient in no apparent distress; mood and affect are within normal limits.  A focused examination was performed including the face and scalp. Relevant physical exam findings are noted in the Assessment and Plan.  Left temple x 1, left temple within hairline x 1 (2) Erythematous stuck-on, waxy papule or plaque   Assessment & Plan  Inflamed seborrheic keratosis (2) Left temple x 1, left temple within hairline x 1  Destruction of lesion - Left temple x 1, left temple within hairline x 1 Complexity: simple   Destruction method: cryotherapy   Informed consent: discussed and consent obtained   Timeout:  patient name, date of birth, surgical site, and procedure verified Lesion destroyed using liquid nitrogen: Yes   Region frozen until ice ball extended beyond lesion: Yes   Outcome: patient tolerated procedure well with no complications   Post-procedure details: wound care instructions given    Seborrheic Keratoses - Stuck-on, waxy, tan-brown papules and/or plaques  - Benign-appearing - Discussed benign etiology and prognosis. - Observe - Call for any changes  Actinic Damage - chronic, secondary to cumulative UV radiation exposure/sun exposure over time - diffuse scaly erythematous macules with underlying dyspigmentation - Recommend daily broad spectrum sunscreen SPF 30+ to sun-exposed areas, reapply every 2 hours as needed.  - Recommend  staying in the shade or wearing long sleeves, sun glasses (UVA+UVB protection) and wide brim hats (4-inch brim around the entire circumference of the hat). - Call for new or changing lesions.  Return if symptoms worsen or fail to improve.  Luther Redo, CMA, am acting as scribe for Sarina Ser, MD . Documentation: I have reviewed the above documentation for accuracy and completeness, and I agree with the above.  Sarina Ser, MD

## 2022-01-30 ENCOUNTER — Encounter: Payer: Self-pay | Admitting: Dermatology

## 2022-07-29 ENCOUNTER — Encounter: Payer: Self-pay | Admitting: Physical Therapy

## 2022-08-03 ENCOUNTER — Encounter: Payer: Self-pay | Admitting: Physical Therapy

## 2022-08-18 ENCOUNTER — Ambulatory Visit: Payer: Medicare HMO | Admitting: Dermatology

## 2022-08-19 ENCOUNTER — Encounter: Payer: Self-pay | Admitting: Dermatology

## 2022-08-19 ENCOUNTER — Ambulatory Visit: Payer: Medicare HMO | Admitting: Dermatology

## 2022-08-19 DIAGNOSIS — L814 Other melanin hyperpigmentation: Secondary | ICD-10-CM | POA: Diagnosis not present

## 2022-08-19 DIAGNOSIS — L820 Inflamed seborrheic keratosis: Secondary | ICD-10-CM | POA: Diagnosis not present

## 2022-08-19 DIAGNOSIS — Z85828 Personal history of other malignant neoplasm of skin: Secondary | ICD-10-CM

## 2022-08-19 DIAGNOSIS — Z1283 Encounter for screening for malignant neoplasm of skin: Secondary | ICD-10-CM | POA: Diagnosis not present

## 2022-08-19 DIAGNOSIS — D1800 Hemangioma unspecified site: Secondary | ICD-10-CM

## 2022-08-19 DIAGNOSIS — D229 Melanocytic nevi, unspecified: Secondary | ICD-10-CM

## 2022-08-19 DIAGNOSIS — L821 Other seborrheic keratosis: Secondary | ICD-10-CM

## 2022-08-19 DIAGNOSIS — L578 Other skin changes due to chronic exposure to nonionizing radiation: Secondary | ICD-10-CM

## 2022-08-19 NOTE — Progress Notes (Signed)
   Follow-Up Visit   Subjective  Alejandro Williamson is a 71 y.o. male who presents for the following: Annual Exam (Hx BCC ). The patient presents for Total-Body Skin Exam (TBSE) for skin cancer screening and mole check.  The patient has spots, moles and lesions to be evaluated, some may be new or changing and the patient has concerns that these could be cancer.  The following portions of the chart were reviewed this encounter and updated as appropriate:   Allergies  Meds  Problems  Med Hx  Surg Hx  Fam Hx     Review of Systems:  No other skin or systemic complaints except as noted in HPI or Assessment and Plan.  Objective  Well appearing patient in no apparent distress; mood and affect are within normal limits.  A full examination was performed including scalp, head, eyes, ears, nose, lips, neck, chest, axillae, abdomen, back, buttocks, bilateral upper extremities, bilateral lower extremities, hands, feet, fingers, toes, fingernails, and toenails. All findings within normal limits unless otherwise noted below.  L sideburn x 1 Erythematous stuck-on, waxy papule or plaque   Assessment & Plan  Inflamed seborrheic keratosis L sideburn x 1 Symptomatic, irritating, patient would like treated. Destruction of lesion - L sideburn x 1 Complexity: simple   Destruction method: cryotherapy   Informed consent: discussed and consent obtained   Timeout:  patient name, date of birth, surgical site, and procedure verified Lesion destroyed using liquid nitrogen: Yes   Region frozen until ice ball extended beyond lesion: Yes   Outcome: patient tolerated procedure well with no complications   Post-procedure details: wound care instructions given    Lentigines - Scattered tan macules - Due to sun exposure - Benign-appearing, observe - Recommend daily broad spectrum sunscreen SPF 30+ to sun-exposed areas, reapply every 2 hours as needed. - Call for any changes  Seborrheic Keratoses - Stuck-on,  waxy, tan-brown papules and/or plaques  - Benign-appearing - Discussed benign etiology and prognosis. - Observe - Call for any changes  Melanocytic Nevi - Tan-brown and/or pink-flesh-colored symmetric macules and papules - Benign appearing on exam today - Observation - Call clinic for new or changing moles - Recommend daily use of broad spectrum spf 30+ sunscreen to sun-exposed areas.   Hemangiomas - Red papules - Discussed benign nature - Observe - Call for any changes  Actinic Damage - Chronic condition, secondary to cumulative UV/sun exposure - diffuse scaly erythematous macules with underlying dyspigmentation - Recommend daily broad spectrum sunscreen SPF 30+ to sun-exposed areas, reapply every 2 hours as needed.  - Staying in the shade or wearing long sleeves, sun glasses (UVA+UVB protection) and wide brim hats (4-inch brim around the entire circumference of the hat) are also recommended for sun protection.  - Call for new or changing lesions.  History of Basal Cell Carcinoma of the Skin - No evidence of recurrence today - Recommend regular full body skin exams - Recommend daily broad spectrum sunscreen SPF 30+ to sun-exposed areas, reapply every 2 hours as needed.  - Call if any new or changing lesions are noted between office visits  Skin cancer screening performed today.  Return in about 1 year (around 08/20/2023) for TBSE - hx BCC.  Luther Redo, CMA, am acting as scribe for Sarina Ser, MD . Documentation: I have reviewed the above documentation for accuracy and completeness, and I agree with the above.  Sarina Ser, MD

## 2022-08-19 NOTE — Patient Instructions (Signed)
Due to recent changes in healthcare laws, you may see results of your pathology and/or laboratory studies on MyChart before the doctors have had a chance to review them. We understand that in some cases there may be results that are confusing or concerning to you. Please understand that not all results are received at the same time and often the doctors may need to interpret multiple results in order to provide you with the best plan of care or course of treatment. Therefore, we ask that you please give us 2 business days to thoroughly review all your results before contacting the office for clarification. Should we see a critical lab result, you will be contacted sooner.   If You Need Anything After Your Visit  If you have any questions or concerns for your doctor, please call our main line at [phone] and press option 4 to reach your doctor's medical assistant. If no one answers, please leave a voicemail as directed and we will return your call as soon as possible. Messages left after 4 pm will be answered the following business day.   You may also send us a message via MyChart. We typically respond to MyChart messages within 1-2 business days.  For prescription refills, please ask your pharmacy to contact our office. Our fax number is [phone].  If you have an urgent issue when the clinic is closed that cannot wait until the next business day, you can page your doctor at the number below.    Please note that while we do our best to be available for urgent issues outside of office hours, we are not available 24/7.   If you have an urgent issue and are unable to reach us, you may choose to seek medical care at your doctor's office, retail clinic, urgent care center, or emergency room.  If you have a medical emergency, please immediately call 911 or go to the emergency department.  Pager Numbers  - Dr. Kowalski: [phone]  - Dr. Moye: [phone]  - Dr. Stewart:  [phone]  In the event of inclement weather, please call our main line at [phone] for an update on the status of any delays or closures.  Dermatology Medication Tips: Please keep the boxes that topical medications come in in order to help keep track of the instructions about where and how to use these. Pharmacies typically print the medication instructions only on the boxes and not directly on the medication tubes.   If your medication is too expensive, please contact our office at [phone] option 4 or send us a message through MyChart.   We are unable to tell what your co-pay for medications will be in advance as this is different depending on your insurance coverage. However, we may be able to find a substitute medication at lower cost or fill out paperwork to get insurance to cover a needed medication.   If a prior authorization is required to get your medication covered by your insurance company, please allow us 1-2 business days to complete this process.  Drug prices often vary depending on where the prescription is filled and some pharmacies may offer cheaper prices.  The website www.goodrx.com contains coupons for medications through different pharmacies. The prices here do not account for what the cost may be with help from insurance (it may be cheaper with your insurance), but the website can give you the price if you did not use any insurance.  - You can print the associated coupon and take it with   your prescription to the pharmacy.  - You may also stop by our office during regular business hours and pick up a GoodRx coupon card.  - If you need your prescription sent electronically to a different pharmacy, notify our office through [facility] MyChart or by phone at [phone] option 4.     Si Usted Necesita Algo Despus de Su Visita  Tambin puede enviarnos un mensaje a travs de MyChart. Por lo general respondemos a los mensajes de MyChart en el transcurso de 1 a 2  das hbiles.  Para renovar recetas, por favor pida a su farmacia que se ponga en contacto con nuestra oficina. Nuestro nmero de fax es el [phone].  Si tiene un asunto urgente cuando la clnica est cerrada y que no puede esperar hasta el siguiente da hbil, puede llamar/localizar a su doctor(a) al nmero que aparece a continuacin.   Por favor, tenga en cuenta que aunque hacemos todo lo posible para estar disponibles para asuntos urgentes fuera del horario de oficina, no estamos disponibles las 24 horas del da, los 7 das de la semana.   Si tiene un problema urgente y no puede comunicarse con nosotros, puede optar por buscar atencin mdica  en el consultorio de su doctor(a), en una clnica privada, en un centro de atencin urgente o en una sala de emergencias.  Si tiene una emergencia mdica, por favor llame inmediatamente al 911 o vaya a la sala de emergencias.  Nmeros de bper  - Dr. Kowalski: [phone]  - Dra. Moye: [phone]  - Dra. Stewart: [phone]  En caso de inclemencias del tiempo, por favor llame a nuestra lnea principal al [phone] para una actualizacin sobre el estado de cualquier retraso o cierre.  Consejos para la medicacin en dermatologa: Por favor, guarde las cajas en las que vienen los medicamentos de uso tpico para ayudarle a seguir las instrucciones sobre dnde y cmo usarlos. Las farmacias generalmente imprimen las instrucciones del medicamento slo en las cajas y no directamente en los tubos del medicamento.   Si su medicamento es muy caro, por favor, pngase en contacto con nuestra oficina llamando al [phone] y presione la opcin 4 o envenos un mensaje a travs de MyChart.   No podemos decirle cul ser su copago por los medicamentos por adelantado ya que esto es diferente dependiendo de la cobertura de su seguro. Sin embargo, es posible que podamos encontrar un medicamento sustituto a menor costo o llenar un formulario para que el  seguro cubra el medicamento que se considera necesario.   Si se requiere una autorizacin previa para que su compaa de seguros cubra su medicamento, por favor permtanos de 1 a 2 das hbiles para completar este proceso.  Los precios de los medicamentos varan con frecuencia dependiendo del lugar de dnde se surte la receta y alguna farmacias pueden ofrecer precios ms baratos.  El sitio web www.goodrx.com tiene cupones para medicamentos de diferentes farmacias. Los precios aqu no tienen en cuenta lo que podra costar con la ayuda del seguro (puede ser ms barato con su seguro), pero el sitio web puede darle el precio si no utiliz ningn seguro.  - Puede imprimir el cupn correspondiente y llevarlo con su receta a la farmacia.  - Tambin puede pasar por nuestra oficina durante el horario de atencin regular y recoger una tarjeta de cupones de GoodRx.  - Si necesita que su receta se enve electrnicamente a una farmacia diferente, informe a nuestra oficina a travs de MyChart de [place]   o por telfono llamando al [phone] y presione la opcin 4.  

## 2022-08-21 ENCOUNTER — Encounter: Payer: Self-pay | Admitting: Dermatology

## 2023-09-02 ENCOUNTER — Ambulatory Visit: Payer: Medicare HMO | Admitting: Dermatology

## 2024-04-15 ENCOUNTER — Inpatient Hospital Stay
Admission: EM | Admit: 2024-04-15 | Discharge: 2024-04-19 | DRG: 522 | Disposition: A | Discharge status: Home Health | Attending: Internal Medicine | Admitting: Internal Medicine

## 2024-04-15 ENCOUNTER — Emergency Department

## 2024-04-15 DIAGNOSIS — Z8249 Family history of ischemic heart disease and other diseases of the circulatory system: Secondary | ICD-10-CM

## 2024-04-15 DIAGNOSIS — Y92008 Other place in unspecified non-institutional (private) residence as the place of occurrence of the external cause: Secondary | ICD-10-CM | POA: Diagnosis not present

## 2024-04-15 DIAGNOSIS — S72012A Unspecified intracapsular fracture of left femur, initial encounter for closed fracture: Secondary | ICD-10-CM | POA: Diagnosis present

## 2024-04-15 DIAGNOSIS — W1789XA Other fall from one level to another, initial encounter: Secondary | ICD-10-CM | POA: Diagnosis present

## 2024-04-15 DIAGNOSIS — M25552 Pain in left hip: Secondary | ICD-10-CM | POA: Diagnosis present

## 2024-04-15 DIAGNOSIS — D508 Other iron deficiency anemias: Secondary | ICD-10-CM | POA: Diagnosis not present

## 2024-04-15 DIAGNOSIS — Z79899 Other long term (current) drug therapy: Secondary | ICD-10-CM | POA: Diagnosis not present

## 2024-04-15 DIAGNOSIS — E785 Hyperlipidemia, unspecified: Secondary | ICD-10-CM | POA: Diagnosis present

## 2024-04-15 DIAGNOSIS — M5412 Radiculopathy, cervical region: Secondary | ICD-10-CM | POA: Diagnosis present

## 2024-04-15 DIAGNOSIS — I951 Orthostatic hypotension: Secondary | ICD-10-CM | POA: Diagnosis not present

## 2024-04-15 DIAGNOSIS — G629 Polyneuropathy, unspecified: Secondary | ICD-10-CM | POA: Diagnosis present

## 2024-04-15 DIAGNOSIS — Z85828 Personal history of other malignant neoplasm of skin: Secondary | ICD-10-CM

## 2024-04-15 DIAGNOSIS — D62 Acute posthemorrhagic anemia: Secondary | ICD-10-CM | POA: Diagnosis not present

## 2024-04-15 DIAGNOSIS — S72002A Fracture of unspecified part of neck of left femur, initial encounter for closed fracture: Secondary | ICD-10-CM | POA: Diagnosis present

## 2024-04-15 DIAGNOSIS — D509 Iron deficiency anemia, unspecified: Secondary | ICD-10-CM | POA: Diagnosis present

## 2024-04-15 DIAGNOSIS — D649 Anemia, unspecified: Secondary | ICD-10-CM

## 2024-04-15 DIAGNOSIS — F32A Depression, unspecified: Secondary | ICD-10-CM | POA: Diagnosis present

## 2024-04-15 DIAGNOSIS — W19XXXA Unspecified fall, initial encounter: Principal | ICD-10-CM

## 2024-04-15 DIAGNOSIS — M4802 Spinal stenosis, cervical region: Secondary | ICD-10-CM | POA: Diagnosis present

## 2024-04-15 DIAGNOSIS — K219 Gastro-esophageal reflux disease without esophagitis: Secondary | ICD-10-CM | POA: Diagnosis present

## 2024-04-15 LAB — COMPREHENSIVE METABOLIC PANEL WITH GFR
ALT: 27 U/L (ref 0–44)
AST: 27 U/L (ref 15–41)
Albumin: 4.2 g/dL (ref 3.5–5.0)
Alkaline Phosphatase: 42 U/L (ref 38–126)
Anion gap: 9 (ref 5–15)
BUN: 14 mg/dL (ref 8–23)
CO2: 24 mmol/L (ref 22–32)
Calcium: 9 mg/dL (ref 8.9–10.3)
Chloride: 105 mmol/L (ref 98–111)
Creatinine, Ser: 0.91 mg/dL (ref 0.61–1.24)
GFR, Estimated: >60 mL/min (ref >60)
Glucose, Bld: 94 mg/dL (ref 70–99)
Potassium: 4 mmol/L (ref 3.5–5.1)
Sodium: 138 mmol/L (ref 135–145)
Total Bilirubin: 0.7 mg/dL (ref 0.0–1.2)
Total Protein: 7 g/dL (ref 6.5–8.1)

## 2024-04-15 MED ORDER — ACETAMINOPHEN 325 MG PO TABS: 650.0000 mg | ORAL_TABLET | ORAL | Status: DC | PRN

## 2024-04-15 MED ORDER — GABAPENTIN 300 MG PO CAPS: 300.0000 mg | ORAL_CAPSULE | Freq: Three times a day (TID) | ORAL | Status: DC

## 2024-04-15 MED ORDER — SODIUM CHLORIDE 0.9 % IV SOLN: INTRAVENOUS | Status: DC

## 2024-04-15 MED ORDER — LORATADINE 10 MG PO TABS
10.0000 mg | ORAL_TABLET | Freq: Every day | ORAL | Status: DC
  Administered 2024-04-16 – 2024-04-19 (×4): 10 mg via ORAL
  Filled 2024-04-15 (×4): qty 1

## 2024-04-15 MED ORDER — GABAPENTIN 100 MG PO CAPS
200.0000 mg | ORAL_CAPSULE | Freq: Two times a day (BID) | ORAL | Status: DC
  Administered 2024-04-15 – 2024-04-17 (×4): 200 mg via ORAL
  Filled 2024-04-15 (×4): qty 2

## 2024-04-15 MED ORDER — CHLORHEXIDINE GLUCONATE 4 % EX SOLN: Freq: Once | CUTANEOUS | Status: AC

## 2024-04-15 MED ORDER — CEFAZOLIN SODIUM-DEXTROSE 2-4 GM/100ML-% IV SOLN
2.0000 g | INTRAVENOUS | Status: AC
  Administered 2024-04-16: 2 g via INTRAVENOUS
  Filled 2024-04-15: qty 100

## 2024-04-15 MED ORDER — MORPHINE SULFATE (PF) 2 MG/ML IV SOLN: 1.0000 mg | INTRAVENOUS | Status: DC | PRN

## 2024-04-15 MED ORDER — HYDROCODONE-ACETAMINOPHEN 5-325 MG PO TABS
1.0000 | ORAL_TABLET | ORAL | Status: DC | PRN
  Administered 2024-04-15: 1 via ORAL
  Filled 2024-04-15: qty 1

## 2024-04-15 MED ORDER — ONDANSETRON HCL 4 MG/2ML IJ SOLN: 4.0000 mg | INTRAMUSCULAR | Status: DC | PRN

## 2024-04-15 MED ORDER — TIZANIDINE HCL 2 MG PO TABS: 2.0000 mg | ORAL_TABLET | Freq: Three times a day (TID) | ORAL | Status: DC | PRN

## 2024-04-15 MED ORDER — MAGNESIUM HYDROXIDE 400 MG/5ML PO SUSP: 30.0000 mL | Freq: Every day | ORAL | Status: DC | PRN

## 2024-04-15 MED ORDER — MORPHINE SULFATE (PF) 2 MG/ML IV SOLN
1.0000 mg | INTRAVENOUS | Status: DC | PRN
  Administered 2024-04-16 (×2): 1 mg via INTRAVENOUS
  Filled 2024-04-15 (×2): qty 1

## 2024-04-15 MED ORDER — ADULT MULTIVITAMIN W/MINERALS CH
1.0000 | ORAL_TABLET | Freq: Every day | ORAL | Status: DC
  Administered 2024-04-16 – 2024-04-19 (×4): 1 via ORAL
  Filled 2024-04-15 (×4): qty 1

## 2024-04-15 MED ORDER — TRANEXAMIC ACID-NACL 1000-0.7 MG/100ML-% IV SOLN
1000.0000 mg | INTRAVENOUS | Status: AC
  Administered 2024-04-16: 1000 mg via INTRAVENOUS
  Filled 2024-04-15: qty 100

## 2024-04-15 MED ORDER — MORPHINE SULFATE (PF) 2 MG/ML IV SOLN: 0.5000 mg | INTRAVENOUS | Status: DC | PRN

## 2024-04-15 MED ORDER — OXYCODONE-ACETAMINOPHEN 5-325 MG PO TABS
1.0000 | ORAL_TABLET | ORAL | Status: DC | PRN
  Administered 2024-04-16: 2 via ORAL
  Filled 2024-04-15: qty 2

## 2024-04-15 MED ORDER — VITAMIN C 500 MG PO TABS
1000.0000 mg | ORAL_TABLET | Freq: Every day | ORAL | Status: DC
  Administered 2024-04-16 – 2024-04-19 (×4): 1000 mg via ORAL
  Filled 2024-04-15 (×4): qty 2

## 2024-04-15 MED ORDER — VITAMIN D 25 MCG (1000 UNIT) PO TABS
1000.0000 [IU] | ORAL_TABLET | Freq: Every day | ORAL | Status: DC
  Administered 2024-04-16 – 2024-04-19 (×4): 1000 [IU] via ORAL
  Filled 2024-04-15 (×4): qty 1

## 2024-04-15 MED ORDER — OXYCODONE-ACETAMINOPHEN 5-325 MG PO TABS: 1.0000 | ORAL_TABLET | ORAL | Status: DC | PRN

## 2024-04-15 MED ORDER — TRAZODONE HCL 50 MG PO TABS: 25.0000 mg | ORAL_TABLET | Freq: Every evening | ORAL | Status: DC | PRN

## 2024-04-15 MED ORDER — CELECOXIB 200 MG PO CAPS
200.0000 mg | ORAL_CAPSULE | Freq: Two times a day (BID) | ORAL | Status: AC
  Administered 2024-04-15: 200 mg via ORAL
  Filled 2024-04-15: qty 1

## 2024-04-15 MED ORDER — MORPHINE SULFATE (PF) 4 MG/ML IV SOLN
4.0000 mg | Freq: Once | INTRAVENOUS | Status: AC
  Administered 2024-04-15: 4 mg via INTRAVENOUS
  Filled 2024-04-15: qty 1

## 2024-04-15 MED ORDER — GABAPENTIN 100 MG PO CAPS: 100.0000 mg | ORAL_CAPSULE | Freq: Three times a day (TID) | ORAL | Status: DC

## 2024-04-15 NOTE — H&P (Signed)
 Alejandro Williamson   PATIENT NAME: Alejandro Williamson    MR#:  [id number]  DATE OF BIRTH:  27-Nov-1951  DATE OF ADMISSION:  04/15/2024  PRIMARY CARE PHYSICIAN: Nestor Banter, MD   Patient is coming from: Home  REQUESTING/REFERRING PHYSICIAN: Marylynn Soho, MD  CHIEF COMPLAINT:   Chief Complaint  Patient presents with   Fall   Hip Pain    HISTORY OF PRESENT ILLNESS:  Alejandro Williamson is a 73 y.o. male with medical history significant for peripheral neuropathy, depression, dyslipidemia and GERD, who presented to the emergency room with acute onset of mechanical fall with subsequent left hip pain.  The patient apparently tripped and fell while getting up and losing balance.  Denies any headache or dizziness or blurred vision.  No paresthesias or focal muscle weakness.  No chest pain or palpitations.  No tinnitus or vertigo.  No cough or wheezing or dyspnea.  No fever or chills.  No dysuria, oliguria or hematuria or flank pain.  No bleeding diathesis.  ED Course: When the patient came to the ER, BP was 160/102 with otherwise normal vital signs.  Labs revealed unremarkable CMP and coag profile.  Blood group is A+ with negative antibody screen.  CBC is pending. EKG as reviewed by me : EKG showed normal sinus rhythm with a rate of 61 with poor R wave progression. Imaging: Portable chest x-ray showed no acute cardiopulmonary disease.  Left hip x-ray showed subcapital left femoral neck fracture.  Left shoulder x-ray showed no acute abnormalities.  The patient was given 4 mg IV morphine sulfate and later 1 more milligrams in Celebrex 200 mg p.o.  The patient will be admitted to a medical/surgical bed for further evaluation and management. PAST MEDICAL HISTORY:   Past Medical History:  Diagnosis Date   Basal cell carcinoma 04/09/2009, 11/18/2009   right med forehead 3cm sup to right medial brow - ED&C  Depression, peripheral neuropathy, dyslipidemia and GERD.  PAST SURGICAL HISTORY:   DRG  device for peripheral neuropathy.  SOCIAL HISTORY:   Social History   Tobacco Use   Smoking status: Not on file   Smokeless tobacco: Not on file  Substance Use Topics   Alcohol use: Not on file  States tobacco-related tributes urine studies.  FAMILY HISTORY:   Positive for MI in his father at the age of 67 and ESRD on hemodialysis in his mother.  DRUG ALLERGIES:  No Known Allergies  REVIEW OF SYSTEMS:   ROS As per history of present illness. All pertinent systems were reviewed above. Constitutional, HEENT, cardiovascular, respiratory, GI, GU, musculoskeletal, neuro, psychiatric, endocrine, integumentary and hematologic systems were reviewed and are otherwise negative/unremarkable except for positive findings mentioned above in the HPI.   MEDICATIONS AT HOME:   Prior to Admission medications   Medication Sig Start Date End Date Taking? Authorizing Provider  ascorbic acid (VITAMIN C) 1000 MG tablet Take by mouth.    [provider]  Cetirizine HCl 10 MG CAPS Take by mouth.    [provider]  Cholecalciferol 25 MCG (1000 UT) capsule Take by mouth.    [provider]  Escitalopram Oxalate (LEXAPRO PO) Take by mouth. Patient not taking: Reported on 08/19/2022    [provider]  gabapentin (NEURONTIN) 100 MG capsule Take 100 mg by mouth 3 (three) times daily. Total of 500mg  a day    [provider]  gabapentin (NEURONTIN) 300 MG capsule Take by mouth. 06/13/21   [provider]  Multiple Vitamin (MULTI-VITAMIN) tablet Take by mouth.    [provider]  tizanidine (ZANAFLEX) 2 MG capsule TAKE 1 CAPSULE BY MOUTH 3 TIMES DAILY AS NEEDED FOR MUSCLE SPASMS. 06/08/22   [provider]      VITAL SIGNS:  Blood pressure (!) 163/90, pulse 66, temperature 97.9 F (36.6 C), resp. rate 20, height 5\' 10"  (1.778 m), weight 94.3 kg, SpO2 100%.  PHYSICAL EXAMINATION:  Physical Exam  GENERAL:  73 y.o.-year-old Caucasian  patient lying in the bed with no acute distress.  EYES: Pupils equal, round, reactive to light and accommodation. No scleral icterus. Extraocular muscles intact.  HEENT: Head atraumatic, normocephalic. Oropharynx and nasopharynx clear.  NECK:  Supple, no jugular venous distention. No thyroid enlargement, no tenderness.  LUNGS: Normal breath sounds bilaterally, no wheezing, rales,rhonchi or crepitation. No use of accessory muscles of respiration.  CARDIOVASCULAR: Regular rate and rhythm, S1, S2 normal. No murmurs, rubs, or gallops.  ABDOMEN: Soft, nondistended, nontender. Bowel sounds present. No organomegaly or mass.  EXTREMITIES: No pedal edema, cyanosis, or clubbing. Musculoskeletal, left lateral hip tenderness. NEUROLOGIC: Cranial nerves II through XII are intact. Muscle strength 5/5 in all extremities. Sensation intact. Gait not checked.  PSYCHIATRIC: The patient is alert and oriented x 3.  Normal affect and good eye contact. SKIN: No obvious rash, lesion, or ulcer.   LABORATORY PANEL:   CBC No results for input(s): "WBC", "HGB", "HCT", "PLT" in the last 168 hours. ------------------------------------------------------------------------------------------------------------------  Chemistries  Recent Labs  Lab 04/15/24 2114  NA 138  K 4.0  CL 105  CO2 24  GLUCOSE 94  BUN 14  CREATININE 0.91  CALCIUM 9.0  AST 27  ALT 27  ALKPHOS 42  BILITOT 0.7   ------------------------------------------------------------------------------------------------------------------  Cardiac Enzymes No results for input(s): "TROPONINI" in the last 168 hours. ------------------------------------------------------------------------------------------------------------------  RADIOLOGY:  DG Hip Unilat W or Wo Pelvis 2-3 Views Left Result Date: 04/15/2024 CLINICAL DATA:  Recent fall with left hip pain, initial encounter EXAM: DG HIP (WITH OR WITHOUT PELVIS) 3V LEFT COMPARISON:  None Available.  FINDINGS: Pelvic ring is intact. Subcapital left femoral neck fracture is noted with impaction and angulation at the fracture site. Femoral head remains well seated. No soft tissue abnormality is noted. IMPRESSION: Subcapital left femoral neck fracture. Electronically Signed   By: Violeta Grey M.D.   On: 04/15/2024 21:05   DG Chest 1 View Result Date: 04/15/2024 CLINICAL DATA:  Known left hip fracture EXAM: PORTABLE CHEST 1 VIEW COMPARISON:  None Available. FINDINGS: Cardiac shadow is within normal limits. Lungs are well aerated bilaterally. No focal infiltrate or effusion is seen. No bony abnormality is noted. IMPRESSION: No acute abnormality noted. Electronically Signed   By: Violeta Grey M.D.   On: 04/15/2024 21:05   DG Shoulder Left Result Date: 04/15/2024 CLINICAL DATA:  Recent fall with left shoulder pain, initial encounter EXAM: LEFT SHOULDER - 2+ VIEW COMPARISON:  None Available. FINDINGS: Degenerative changes of the acromioclavicular joint are seen. No acute fracture or dislocation is noted. No soft tissue abnormality is seen. IMPRESSION: No acute abnormality noted. Electronically Signed   By: Violeta Grey M.D.   On: 04/15/2024 21:04      IMPRESSION AND PLAN:  Assessment and Plan: * Closed left hip fracture, initial encounter [facility]) - The patient will need to a medical-surgical bed. - Pain management will be provided. - Orthopedic consult will be obtained. - Dr. Oren Bing was notified about the patient. - The patient has no history of CHF,  CVA, CAD, renal failure with creatinine more than 2 or diabetes mellitus on insulin.  He is considered at average risk for his age for perioperative cardiovascular events per the revised cardiac risk index.  He has no current pulmonary issues.  Peripheral neuropathy Will continue Neurontin.  Dyslipidemia - Will continue statin therapy.   DVT prophylaxis: SCDs. Advanced Care Planning:  Code Status: full code. Family Communication:  The plan of care was  discussed in details with the patient (and family). I answered all questions. The patient agreed to proceed with the above mentioned plan. Further management will depend upon hospital course. Disposition Plan: Back to previous home environment Consults called: Orthopedic surgery All the records are reviewed and case discussed with ED provider.  Status is: Inpatient  At the time of the admission, it appears that the appropriate admission status for this patient is inpatient.  This is judged to be reasonable and necessary in order to provide the required intensity of service to ensure the patient's safety given the presenting symptoms, physical exam findings and initial radiographic and laboratory data in the context of comorbid conditions.  The patient requires inpatient status due to high intensity of service, high risk of further deterioration and high frequency of surveillance required.  I certify that at the time of admission, it is my clinical judgment that the patient will require inpatient hospital care extending more than 2 midnights.                            Dispo: The patient is from: Home              Anticipated d/c is to: Home              Patient currently is not medically stable to d/c.              Difficult to place patient: No  Virgene Griffin M.D on 04/16/2024 at 2:18 AM  [facility]   From 7 PM-7 AM, contact night-coverage [email or web address]  CC: Primary care physician; Nestor Banter, MD

## 2024-04-15 NOTE — ED Triage Notes (Signed)
 Patient fell on the porch after tripping on a step about an hour ago. Patient noted to have shortening of the left leg and left hip pain.

## 2024-04-15 NOTE — ED Provider Notes (Signed)
 [facility] Provider Note    Event Date/Time   First MD Initiated Contact with Patient 04/15/24 2007     (approximate)   History   Fall and Hip Pain   HPI  Alejandro Williamson is a 73 y.o. male who presents to the emergency department today because of concerns for left hip pain after a fall.  The patient states that he tripped and that is what caused him to fall.  He was working on his garden.  He denies hitting his head.  He is complaining of some left shoulder pain as well.  Denies any recent illness.  No recent fevers or chills.     Physical Exam   Triage Vital Signs: ED Triage Vitals  Encounter Vitals Group     BP 04/15/24 2001 (!) 160/102     Systolic BP Percentile --      Diastolic BP Percentile --      Pulse Rate 04/15/24 2001 69     Resp --      Temp 04/15/24 2001 98.3 F (36.8 C)     Temp Source 04/15/24 2001 Oral     SpO2 04/15/24 2001 96 %     Weight 04/15/24 2000 208 lb (94.3 kg)     Height 04/15/24 2000 5\' 10"  (1.778 m)     Head Circumference --      Peak Flow --      Pain Score 04/15/24 1959 3     Pain Loc --      Pain Education --      Exclude from Growth Chart --     Most recent vital signs: Vitals:   04/15/24 2001  BP: (!) 160/102  Pulse: 69  Temp: 98.3 F (36.8 C)  SpO2: 96%   General: Awake, alert, oriented. CV:  Good peripheral perfusion. Regular rate and rhythm. Resp:  Normal effort. Lungs clear. Abd:  No distention. Non tender. Other:  Left hip with pain with manipulation.   ED Results / Procedures / Treatments   Labs (all labs ordered are listed, but only abnormal results are displayed) Labs Reviewed  COMPREHENSIVE METABOLIC PANEL WITH GFR  PROTIME-INR  URINALYSIS, ROUTINE W REFLEX MICROSCOPIC  CBC WITH DIFFERENTIAL/PLATELET  TYPE AND SCREEN     EKG  I, Marylynn Soho, attending physician, personally viewed and interpreted this EKG  EKG Time: 2142 Rate: 61 Rhythm: normal sinus rhythm Axis:  normal Intervals: qtc 453 QRS: narrow ST changes: no st elevation Impression: normal ekg    RADIOLOGY I independently interpreted and visualized the left hip. My interpretation: left hip fracture Radiology interpretation:  IMPRESSION:  Subcapital left femoral neck fracture.    I independently interpreted and visualized the left shoulder. My interpretation: no fracture Radiology interpretation:  IMPRESSION:  No acute abnormality noted.    I independently interpreted and visualized the CXR. My interpretation: No pneumonia Radiology interpretation:  IMPRESSION:  No acute abnormality noted.     PROCEDURES:  Critical Care performed: No   MEDICATIONS ORDERED IN ED: Medications - No data to display   IMPRESSION / MDM / ASSESSMENT AND PLAN / ED COURSE  I reviewed the triage vital signs and the nursing notes.                              Differential diagnosis includes, but is not limited to, fracture, dislocation, contusion  Patient's presentation is most consistent with acute presentation with potential threat  to life or bodily function.   Patient presented to the emergency department today because of concerns for left hip pain after a fall.  Also concern for some left shoulder pain.  X-ray does show left hip fracture.  Discussed with Dr. Annabell Key with orthopedics will plan on taking him to the operating room tomorrow.  Discussed with Dr. Achilles Holes with the hospitalist service who will evaluate for admission.    FINAL CLINICAL IMPRESSION(S) / ED DIAGNOSES   Final diagnoses:  Fall, initial encounter  Closed fracture of left hip, initial encounter [facility])      Note:  This document was prepared using Dragon voice recognition software and may include unintentional dictation errors.    Marylynn Soho, MD 04/15/24 [phone]

## 2024-04-16 ENCOUNTER — Inpatient Hospital Stay: Admitting: Anesthesiology

## 2024-04-16 ENCOUNTER — Encounter
Admission: EM | Disposition: A | Discharge status: Home Health | Payer: Self-pay | Source: Home / Self Care | Attending: Internal Medicine

## 2024-04-16 ENCOUNTER — Other Ambulatory Visit: Payer: Self-pay

## 2024-04-16 ENCOUNTER — Inpatient Hospital Stay

## 2024-04-16 ENCOUNTER — Encounter: Payer: Self-pay | Admitting: Family Medicine

## 2024-04-16 DIAGNOSIS — S72002A Fracture of unspecified part of neck of left femur, initial encounter for closed fracture: Secondary | ICD-10-CM | POA: Diagnosis not present

## 2024-04-16 DIAGNOSIS — E785 Hyperlipidemia, unspecified: Secondary | ICD-10-CM | POA: Insufficient documentation

## 2024-04-16 DIAGNOSIS — G629 Polyneuropathy, unspecified: Secondary | ICD-10-CM

## 2024-04-16 HISTORY — PX: HIP ARTHROPLASTY: SHX981

## 2024-04-16 LAB — CBC WITH DIFFERENTIAL/PLATELET
Abs Immature Granulocytes: 0.04 10*3/uL (ref 0.00–0.07)
Basophils Absolute: 0.1 10*3/uL (ref 0.0–0.1)
Basophils Relative: 1 %
Eosinophils Absolute: 0.1 10*3/uL (ref 0.0–0.5)
Eosinophils Relative: 1 %
HCT: 39.3 % (ref 39.0–52.0)
Hemoglobin: 13 g/dL (ref 13.0–17.0)
Immature Granulocytes: 0 %
Lymphocytes Relative: 19 %
Lymphs Abs: 2.1 10*3/uL (ref 0.7–4.0)
MCH: 28.8 pg (ref 26.0–34.0)
MCHC: 33.1 g/dL (ref 30.0–36.0)
MCV: 87.1 fL (ref 80.0–100.0)
Monocytes Absolute: 1.2 10*3/uL — ABNORMAL HIGH (ref 0.1–1.0)
Monocytes Relative: 11 %
Neutro Abs: 7.5 10*3/uL (ref 1.7–7.7)
Neutrophils Relative %: 68 %
Platelets: 187 10*3/uL (ref 150–400)
RBC: 4.51 MIL/uL (ref 4.22–5.81)
RDW: 14.2 % (ref 11.5–15.5)
WBC: 10.9 10*3/uL — ABNORMAL HIGH (ref 4.0–10.5)
nRBC: 0 % (ref 0.0–0.2)

## 2024-04-16 LAB — BASIC METABOLIC PANEL WITH GFR
Anion gap: 7 (ref 5–15)
BUN: 13 mg/dL (ref 8–23)
CO2: 24 mmol/L (ref 22–32)
Calcium: 8.7 mg/dL — ABNORMAL LOW (ref 8.9–10.3)
Chloride: 109 mmol/L (ref 98–111)
Creatinine, Ser: 0.89 mg/dL (ref 0.61–1.24)
GFR, Estimated: >60 mL/min (ref >60)
Glucose, Bld: 102 mg/dL — ABNORMAL HIGH (ref 70–99)
Potassium: 3.6 mmol/L (ref 3.5–5.1)
Sodium: 140 mmol/L (ref 135–145)

## 2024-04-16 LAB — TYPE AND SCREEN
ABO/RH(D): A POS
Antibody Screen: NEGATIVE

## 2024-04-16 LAB — URINALYSIS, ROUTINE W REFLEX MICROSCOPIC
Bilirubin Urine: NEGATIVE
Glucose, UA: NEGATIVE mg/dL
Hgb urine dipstick: NEGATIVE
Ketones, ur: 5 mg/dL — AB
Leukocytes,Ua: NEGATIVE
Nitrite: NEGATIVE
Protein, ur: NEGATIVE mg/dL
Specific Gravity, Urine: 1.005 (ref 1.005–1.030)
pH: 8 (ref 5.0–8.0)

## 2024-04-16 LAB — PROTIME-INR
INR: 1.1 (ref 0.8–1.2)
Prothrombin Time: 14.2 s (ref 11.4–15.2)

## 2024-04-16 SURGERY — HEMIARTHROPLASTY (BIPOLAR) HIP, POSTERIOR APPROACH FOR FRACTURE
Anesthesia: General | Site: Hip | Laterality: Left

## 2024-04-16 MED ORDER — SODIUM CHLORIDE 0.9 % IR SOLN
Status: DC | PRN
  Administered 2024-04-16: 3000 mL

## 2024-04-16 MED ORDER — HYDROCODONE-ACETAMINOPHEN 7.5-325 MG PO TABS: 1.0000 | ORAL_TABLET | ORAL | Status: DC | PRN

## 2024-04-16 MED ORDER — KETAMINE HCL 50 MG/5ML IJ SOSY
PREFILLED_SYRINGE | INTRAMUSCULAR | Status: DC | PRN
  Administered 2024-04-16: 30 mg via INTRAVENOUS

## 2024-04-16 MED ORDER — ONDANSETRON HCL 4 MG PO TABS: 4.0000 mg | ORAL_TABLET | Freq: Four times a day (QID) | ORAL | Status: DC | PRN

## 2024-04-16 MED ORDER — PROPOFOL 10 MG/ML IV BOLUS
INTRAVENOUS | Status: AC
  Filled 2024-04-16: qty 20

## 2024-04-16 MED ORDER — LIDOCAINE HCL (CARDIAC) PF 100 MG/5ML IV SOSY
PREFILLED_SYRINGE | INTRAVENOUS | Status: DC | PRN
  Administered 2024-04-16: 80 mg via INTRAVENOUS

## 2024-04-16 MED ORDER — LACTATED RINGERS IV SOLN: INTRAVENOUS | Status: DC | PRN

## 2024-04-16 MED ORDER — PROPOFOL 10 MG/ML IV BOLUS
INTRAVENOUS | Status: DC | PRN
  Administered 2024-04-16: 120 mg via INTRAVENOUS

## 2024-04-16 MED ORDER — ALUM & MAG HYDROXIDE-SIMETH 200-200-20 MG/5ML PO SUSP
30.0000 mL | ORAL | Status: DC | PRN
  Administered 2024-04-17: 30 mL via ORAL
  Filled 2024-04-16: qty 30

## 2024-04-16 MED ORDER — ROCURONIUM BROMIDE 100 MG/10ML IV SOLN
INTRAVENOUS | Status: DC | PRN
  Administered 2024-04-16: 30 mg via INTRAVENOUS
  Administered 2024-04-16: 50 mg via INTRAVENOUS

## 2024-04-16 MED ORDER — MENTHOL 3 MG MT LOZG: 1.0000 | LOZENGE | OROMUCOSAL | Status: DC | PRN

## 2024-04-16 MED ORDER — TRANEXAMIC ACID-NACL 1000-0.7 MG/100ML-% IV SOLN
INTRAVENOUS | Status: AC
  Filled 2024-04-16: qty 100

## 2024-04-16 MED ORDER — DEXMEDETOMIDINE HCL IN NACL 80 MCG/20ML IV SOLN
INTRAVENOUS | Status: DC | PRN
  Administered 2024-04-16 (×2): 8 ug via INTRAVENOUS

## 2024-04-16 MED ORDER — TRANEXAMIC ACID-NACL 1000-0.7 MG/100ML-% IV SOLN
1000.0000 mg | Freq: Once | INTRAVENOUS | Status: AC
  Administered 2024-04-16: 1000 mg via INTRAVENOUS

## 2024-04-16 MED ORDER — FENTANYL CITRATE (PF) 100 MCG/2ML IJ SOLN
INTRAMUSCULAR | Status: DC | PRN
  Administered 2024-04-16 (×2): 50 ug via INTRAVENOUS

## 2024-04-16 MED ORDER — BISACODYL 10 MG RE SUPP: 10.0000 mg | Freq: Every day | RECTAL | Status: DC | PRN

## 2024-04-16 MED ORDER — METOCLOPRAMIDE HCL 5 MG/ML IJ SOLN: 5.0000 mg | Freq: Three times a day (TID) | INTRAMUSCULAR | Status: DC | PRN

## 2024-04-16 MED ORDER — METOCLOPRAMIDE HCL 5 MG PO TABS: 5.0000 mg | ORAL_TABLET | Freq: Three times a day (TID) | ORAL | Status: DC | PRN

## 2024-04-16 MED ORDER — BUPIVACAINE-EPINEPHRINE 0.5% -1:200000 IJ SOLN
INTRAMUSCULAR | Status: DC | PRN
  Administered 2024-04-16: 30 mL

## 2024-04-16 MED ORDER — ONDANSETRON HCL 4 MG/2ML IJ SOLN: 4.0000 mg | Freq: Four times a day (QID) | INTRAMUSCULAR | Status: DC | PRN

## 2024-04-16 MED ORDER — DOCUSATE SODIUM 100 MG PO CAPS
100.0000 mg | ORAL_CAPSULE | Freq: Two times a day (BID) | ORAL | Status: DC
  Administered 2024-04-16 – 2024-04-19 (×7): 100 mg via ORAL
  Filled 2024-04-16 (×7): qty 1

## 2024-04-16 MED ORDER — PHENYLEPHRINE 80 MCG/ML (10ML) SYRINGE FOR IV PUSH (FOR BLOOD PRESSURE SUPPORT)
PREFILLED_SYRINGE | INTRAVENOUS | Status: DC | PRN
  Administered 2024-04-16 (×2): 160 ug via INTRAVENOUS

## 2024-04-16 MED ORDER — KETAMINE HCL 50 MG/5ML IJ SOSY
PREFILLED_SYRINGE | INTRAMUSCULAR | Status: AC
Start: 2024-04-16 — End: ?
  Filled 2024-04-16: qty 5

## 2024-04-16 MED ORDER — OXYCODONE HCL 5 MG/5ML PO SOLN: 5.0000 mg | Freq: Once | ORAL | Status: DC | PRN

## 2024-04-16 MED ORDER — METHOCARBAMOL 1000 MG/10ML IJ SOLN: 500.0000 mg | Freq: Four times a day (QID) | INTRAMUSCULAR | Status: DC | PRN

## 2024-04-16 MED ORDER — HYDROCODONE-ACETAMINOPHEN 5-325 MG PO TABS
1.0000 | ORAL_TABLET | ORAL | Status: DC | PRN
  Administered 2024-04-17 – 2024-04-18 (×2): 2 via ORAL
  Filled 2024-04-16 (×2): qty 2

## 2024-04-16 MED ORDER — CEFAZOLIN SODIUM-DEXTROSE 2-4 GM/100ML-% IV SOLN
INTRAVENOUS | Status: AC
  Filled 2024-04-16: qty 100

## 2024-04-16 MED ORDER — SUGAMMADEX SODIUM 200 MG/2ML IV SOLN
INTRAVENOUS | Status: DC | PRN
  Administered 2024-04-16: 200 mg via INTRAVENOUS

## 2024-04-16 MED ORDER — EPHEDRINE SULFATE-NACL 50-0.9 MG/10ML-% IV SOSY
PREFILLED_SYRINGE | INTRAVENOUS | Status: DC | PRN
  Administered 2024-04-16: 5 mg via INTRAVENOUS

## 2024-04-16 MED ORDER — PHENYLEPHRINE HCL-NACL 20-0.9 MG/250ML-% IV SOLN
INTRAVENOUS | Status: DC | PRN
  Administered 2024-04-16: 20 ug/min via INTRAVENOUS

## 2024-04-16 MED ORDER — SODIUM CHLORIDE 0.45 % IV SOLN: INTRAVENOUS | Status: DC

## 2024-04-16 MED ORDER — CEFAZOLIN SODIUM-DEXTROSE 2-4 GM/100ML-% IV SOLN
2.0000 g | Freq: Three times a day (TID) | INTRAVENOUS | Status: AC
  Administered 2024-04-16 – 2024-04-17 (×3): 2 g via INTRAVENOUS
  Filled 2024-04-16 (×3): qty 100

## 2024-04-16 MED ORDER — PHENYLEPHRINE HCL-NACL 20-0.9 MG/250ML-% IV SOLN
INTRAVENOUS | Status: AC
  Filled 2024-04-16: qty 250

## 2024-04-16 MED ORDER — ENOXAPARIN SODIUM 30 MG/0.3ML IJ SOSY
30.0000 mg | PREFILLED_SYRINGE | INTRAMUSCULAR | Status: DC
  Administered 2024-04-17 – 2024-04-19 (×3): 30 mg via SUBCUTANEOUS
  Filled 2024-04-16 (×3): qty 0.3

## 2024-04-16 MED ORDER — KETOROLAC TROMETHAMINE 30 MG/ML IJ SOLN
INTRAMUSCULAR | Status: DC | PRN
  Administered 2024-04-16: 30 mg via INTRAVENOUS

## 2024-04-16 MED ORDER — OXYCODONE HCL 5 MG PO TABS: 5.0000 mg | ORAL_TABLET | Freq: Once | ORAL | Status: DC | PRN

## 2024-04-16 MED ORDER — DEXAMETHASONE SODIUM PHOSPHATE 10 MG/ML IJ SOLN
INTRAMUSCULAR | Status: DC | PRN
  Administered 2024-04-16: 10 mg via INTRAVENOUS

## 2024-04-16 MED ORDER — ACETAMINOPHEN 10 MG/ML IV SOLN
INTRAVENOUS | Status: DC | PRN
  Administered 2024-04-16: 1000 mg via INTRAVENOUS

## 2024-04-16 MED ORDER — PHENOL 1.4 % MT LIQD: 1.0000 | OROMUCOSAL | Status: DC | PRN

## 2024-04-16 MED ORDER — FENTANYL CITRATE (PF) 100 MCG/2ML IJ SOLN: 25.0000 ug | INTRAMUSCULAR | Status: DC | PRN

## 2024-04-16 MED ORDER — FENTANYL CITRATE (PF) 100 MCG/2ML IJ SOLN
INTRAMUSCULAR | Status: AC
Start: 2024-04-16 — End: ?
  Filled 2024-04-16: qty 2

## 2024-04-16 MED ORDER — ACETAMINOPHEN 10 MG/ML IV SOLN
INTRAVENOUS | Status: AC
  Filled 2024-04-16: qty 100

## 2024-04-16 MED ORDER — 0.9 % SODIUM CHLORIDE (POUR BTL) OPTIME
TOPICAL | Status: DC | PRN
  Administered 2024-04-16: 1000 mL

## 2024-04-16 MED ORDER — ACETAMINOPHEN 325 MG PO TABS
325.0000 mg | ORAL_TABLET | Freq: Four times a day (QID) | ORAL | Status: DC | PRN
  Administered 2024-04-17 – 2024-04-19 (×4): 650 mg via ORAL
  Filled 2024-04-16 (×4): qty 2

## 2024-04-16 MED ORDER — ONDANSETRON HCL 4 MG/2ML IJ SOLN
INTRAMUSCULAR | Status: DC | PRN
  Administered 2024-04-16: 4 mg via INTRAVENOUS

## 2024-04-16 MED ORDER — FLEET ENEMA RE ENEM: 1.0000 | ENEMA | Freq: Once | RECTAL | Status: DC | PRN

## 2024-04-16 MED ORDER — FERROUS SULFATE 325 (65 FE) MG PO TABS
325.0000 mg | ORAL_TABLET | Freq: Every day | ORAL | Status: DC
  Administered 2024-04-17 – 2024-04-18 (×2): 325 mg via ORAL
  Filled 2024-04-16 (×2): qty 1

## 2024-04-16 MED ORDER — MORPHINE SULFATE (PF) 2 MG/ML IV SOLN: 0.5000 mg | INTRAVENOUS | Status: DC | PRN

## 2024-04-16 MED ORDER — METHOCARBAMOL 500 MG PO TABS: 500.0000 mg | ORAL_TABLET | Freq: Four times a day (QID) | ORAL | Status: DC | PRN

## 2024-04-16 MED ORDER — ZOLPIDEM TARTRATE 5 MG PO TABS
5.0000 mg | ORAL_TABLET | Freq: Every evening | ORAL | Status: DC | PRN
  Administered 2024-04-16: 5 mg via ORAL
  Filled 2024-04-16: qty 1

## 2024-04-16 MED ORDER — CELECOXIB 200 MG PO CAPS
200.0000 mg | ORAL_CAPSULE | Freq: Two times a day (BID) | ORAL | Status: AC
  Administered 2024-04-16: 200 mg via ORAL
  Filled 2024-04-16: qty 1

## 2024-04-16 SURGICAL SUPPLY — 48 items
BLADE SAGITTAL WIDE XTHICK NO (BLADE) ×1 IMPLANT
CHLORAPREP W/TINT 26 (MISCELLANEOUS) ×2 IMPLANT
DRAPE INCISE IOBAN 66X60 STRL (DRAPES) ×2 IMPLANT
DRAPE TABLE BACK 80X90 (DRAPES) ×1 IMPLANT
DRSG XEROFORM 1X8 (GAUZE/BANDAGES/DRESSINGS) IMPLANT
ELECT BLADE 6.5 EXT (BLADE) IMPLANT
ELECT CAUTERY BLADE 6.4 (BLADE) ×1 IMPLANT
ELECTRODE REM PT RTRN 9FT ADLT (ELECTROSURGICAL) ×1 IMPLANT
EVACUATOR 1/8 PVC DRAIN (DRAIN) ×1 IMPLANT
GAUZE 4X4 16PLY ~~LOC~~+RFID DBL (SPONGE) ×1 IMPLANT
GAUZE SPONGE 4X4 12PLY STRL (GAUZE/BANDAGES/DRESSINGS) ×2 IMPLANT
GAUZE XEROFORM 1X8 LF (GAUZE/BANDAGES/DRESSINGS) ×2 IMPLANT
GLOVE INDICATOR 8.0 STRL GRN (GLOVE) ×1 IMPLANT
GLOVE SURG ORTHO 8.5 STRL (GLOVE) ×1 IMPLANT
GOWN STRL REUS W/ TWL LRG LVL3 (GOWN DISPOSABLE) ×2 IMPLANT
GOWN STRL REUS W/TWL LRG LVL4 (GOWN DISPOSABLE) ×1 IMPLANT
HEAD UNPLR 53XMDLR STRL HIP (Orthopedic Implant) IMPLANT
HOLSTER ELECTROSUGICAL PENCIL (MISCELLANEOUS) ×1 IMPLANT
IV NS 1000ML BAXH (IV SOLUTION) ×1 IMPLANT
KIT TURNOVER KIT A (KITS) ×1 IMPLANT
MANIFOLD NEPTUNE II (INSTRUMENTS) ×1 IMPLANT
NDL FILTER BLUNT 18X1 1/2 (NEEDLE) ×1 IMPLANT
NDL SPNL 18GX3.5 QUINCKE PK (NEEDLE) ×2 IMPLANT
NEEDLE FILTER BLUNT 18X1 1/2 (NEEDLE) ×1 IMPLANT
NEEDLE SPNL 18GX3.5 QUINCKE PK (NEEDLE) ×2 IMPLANT
NS IRRIG 1000ML POUR BTL (IV SOLUTION) ×1 IMPLANT
PACK HIP PROSTHESIS (MISCELLANEOUS) ×1 IMPLANT
PAD ABD DERMACEA PRESS 5X9 (GAUZE/BANDAGES/DRESSINGS) IMPLANT
PENCIL SMOKE EVACUATOR (MISCELLANEOUS) ×1 IMPLANT
PILLOW ABDUCTION FOAM SM (MISCELLANEOUS) IMPLANT
SLEEVE UNITRAX V40 +4 (Orthopedic Implant) IMPLANT
SOLUTION PREP PVP 2OZ (MISCELLANEOUS) ×1 IMPLANT
SPONGE T-LAP 18X18 ~~LOC~~+RFID (SPONGE) ×4 IMPLANT
STAPLER SKIN PROX 35W (STAPLE) ×1 IMPLANT
STEM ACCOLADE SZ 6 (Hips) IMPLANT
SUT STRATA PDS 2-0 CT-1 (SUTURE) ×1 IMPLANT
SUT STRATAFIX 14 PDO 36 VLT (SUTURE) ×1 IMPLANT
SUT STRATAFIX PDO 1 14 VIOLET (SUTURE) ×1 IMPLANT
SUT TICRON 2-0 30IN 311381 (SUTURE) ×4 IMPLANT
SYR 10ML LL (SYRINGE) ×1 IMPLANT
SYR 30ML LL (SYRINGE) ×1 IMPLANT
SYR 50ML LL SCALE MARK (SYRINGE) ×1 IMPLANT
TAPE MICROFOAM 4IN (TAPE) ×1 IMPLANT
TIP FAN IRRIG PULSAVAC PLUS (DISPOSABLE) ×1 IMPLANT
TRAP FLUID SMOKE EVACUATOR (MISCELLANEOUS) ×1 IMPLANT
TUBE SUCT KAM VAC (TUBING) ×1 IMPLANT
WATER STERILE IRR 1000ML POUR (IV SOLUTION) ×1 IMPLANT
WATER STERILE IRR 500ML POUR (IV SOLUTION) ×1 IMPLANT

## 2024-04-16 NOTE — Assessment & Plan Note (Signed)
-   The patient will need to a medical-surgical bed. - Pain management will be provided. - Orthopedic consult will be obtained. - Dr. Oren Bing was notified about the patient. - The patient has no history of CHF, CVA, CAD, renal failure with creatinine more than 2 or diabetes mellitus on insulin.  He is considered at average risk for his age for perioperative cardiovascular events per the revised cardiac risk index.  He has no current pulmonary issues.

## 2024-04-16 NOTE — Anesthesia Preprocedure Evaluation (Signed)
 Anesthesia Evaluation  Patient identified by MRN, date of birth, ID band Patient awake    Reviewed: Allergy & Precautions, NPO status , Patient's Chart, lab work & pertinent test results  History of Anesthesia Complications Negative for: history of anesthetic complications  Airway Mallampati: III  TM Distance: <3 FB Neck ROM: full    Dental  (+) Chipped   Pulmonary neg pulmonary ROS, neg shortness of breath   Pulmonary exam normal        Cardiovascular Exercise Tolerance: Good (-) angina (-) Past MI and (-) DOE negative cardio ROS Normal cardiovascular exam     Neuro/Psych  Neuromuscular disease  negative psych ROS   GI/Hepatic negative GI ROS, Neg liver ROS,neg GERD  ,,  Endo/Other  negative endocrine ROS    Renal/GU negative Renal ROS  negative genitourinary   Musculoskeletal   Abdominal   Peds  Hematology negative hematology ROS (+)   Anesthesia Other Findings Past Medical History: 04/09/2009, 11/18/2009: Basal cell carcinoma     Comment:  right med forehead 3cm sup to right medial brow - ED&C  History reviewed. No pertinent surgical history.  BMI    Body Mass Index: 29.84 kg/m      Reproductive/Obstetrics negative OB ROS                             Anesthesia Physical Anesthesia Plan  ASA: 2  Anesthesia Plan: General ETT   Post-op Pain Management:    Induction: Intravenous  PONV Risk Score and Plan: Propofol infusion and TIVA  Airway Management Planned: Oral ETT  Additional Equipment:   Intra-op Plan:   Post-operative Plan: Extubation in OR  Informed Consent: I have reviewed the patients History and Physical, chart, labs and discussed the procedure including the risks, benefits and alternatives for the proposed anesthesia with the patient or authorized representative who has indicated his/her understanding and acceptance.     Dental Advisory Given  Plan  Discussed with: Anesthesiologist, CRNA and Surgeon  Anesthesia Plan Comments: (Patient declines spinal and requests GA  Patient consented for risks of anesthesia including but not limited to:  - adverse reactions to medications - damage to eyes, teeth, lips or other oral mucosa - nerve damage due to positioning  - sore throat or hoarseness - Damage to heart, brain, nerves, lungs, other parts of body or loss of life  Patient voiced understanding and assent.)       Anesthesia Quick Evaluation

## 2024-04-16 NOTE — Assessment & Plan Note (Signed)
 Will continue statin therapy

## 2024-04-16 NOTE — TOC Initial Note (Signed)
 Transition of Care [facility]) - Initial/Assessment Note    Patient Details  Name: Alejandro Williamson MRN: [id number] Date of Birth: 05-27-1951  Transition of Care [facility]) CM/SW Contact:    Alexandra Ice, RN Phone Number: 04/16/2024, 1:57 PM  Clinical Narrative:                 Transition of Care [facility]) - Inpatient Brief Assessment   Patient Details  Name: Alejandro Williamson MRN: [id number] Date of Birth: 1951-04-14  Transition of Care [facility]) CM/SW Contact:    Alexandra Ice, RN Phone Number: 04/16/2024, 1:57 PM   Clinical Narrative: Patient lives with spouse. Has PCP. Independent with ADLs. PT/OT eval pending for discharge recommendations. TOC will follow for discharge needs   Transition of Care Asessment: Insurance and Status: Insurance coverage has been reviewed Patient has primary care physician: Yes Home environment has been reviewed: lives with spouse Prior level of function:: independent Prior/Current Home Services: No current home services Social Drivers of Health Review: SDOH reviewed no interventions necessary Readmission risk has been reviewed: Yes Transition of care needs: transition of care needs identified, TOC will continue to follow         Patient Goals and CMS Choice            Expected Discharge Plan and Services                                              Prior Living Arrangements/Services                       Activities of Daily Living   ADL Screening (condition at time of admission) Independently performs ADLs?: No Does the patient have a NEW difficulty with bathing/dressing/toileting/self-feeding that is expected to last >3 days?: Yes (Initiates electronic notice to provider for possible OT consult) Does the patient have a NEW difficulty with getting in/out of bed, walking, or climbing stairs that is expected to last >3 days?: Yes (Initiates electronic notice to provider for possible PT consult) Does the patient have a  NEW difficulty with communication that is expected to last >3 days?: No Is the patient deaf or have difficulty hearing?: No Does the patient have difficulty seeing, even when wearing glasses/contacts?: No Does the patient have difficulty concentrating, remembering, or making decisions?: No  Permission Sought/Granted                  Emotional Assessment              Admission diagnosis:  Closed fracture of left hip, initial encounter (HCC) [S72.002A] Fall, initial encounter [W19.XXXA] Closed left hip fracture, initial encounter [facility]) [S72.002A] Patient Active Problem List   Diagnosis Date Noted   Dyslipidemia 04/16/2024   Peripheral neuropathy 04/16/2024   Closed left hip fracture, initial encounter (HCC) 04/15/2024   PCP:  Nestor Banter, MD Pharmacy:   [facility] [phone] [place], [place] - [address] [place] [address] Phone: [phone] Fax: [phone]     Social Drivers of Health (SDOH) Social History: SDOH Screenings   Food Insecurity: No Food Insecurity (04/16/2024)  Housing: Low Risk  (04/16/2024)  Transportation Needs: No Transportation Needs (04/16/2024)  Utilities: Not At Risk (04/16/2024)  Financial Resource Strain: Low Risk  (02/21/2024)   Received from [facility]  Social Connections: Socially Integrated (04/16/2024)  Tobacco  Use: Low Risk  (02/21/2024)   Received from [facility]   SDOH Interventions:     Readmission Risk Interventions     No data to display

## 2024-04-16 NOTE — Consult Note (Signed)
 ORTHOPAEDIC CONSULTATION  REQUESTING PHYSICIAN: Tiajuana Fluke, MD  Chief Complaint: Left hip pain  HPI: Alejandro Williamson is a 73 y.o. male who complains of left hip pain after a fall in his garden yesterday evening.  The patient was brought to the emergency room where exam and x-rays revealed a displaced subparagraph capital fracture of the left hip.  The patient is a retired Education officer, environmental in good health.  He is been cleared by the medical service for surgery.  I have discussed treatment with the patient and his his wife by phone and I recommended a hemiarthroplasty of the hip.  The risks and benefits and postop protocols were discussed with them.  They are in agreement and wished to proceed today.  Past Medical History:  Diagnosis Date   Basal cell carcinoma 04/09/2009, 11/18/2009   right med forehead 3cm sup to right medial brow - ED&C   History reviewed. No pertinent surgical history. Social History   Socioeconomic History   Marital status: Married    Spouse name: Not on file   Number of children: Not on file   Years of education: Not on file   Highest education level: Not on file  Occupational History   Not on file  Tobacco Use   Smoking status: Not on file   Smokeless tobacco: Not on file  Substance and Sexual Activity   Alcohol use: Not on file   Drug use: Not on file   Sexual activity: Not on file  Other Topics Concern   Not on file  Social History Narrative   Not on file   Social Drivers of Health   Financial Resource Strain: Low Risk  (02/21/2024)   Received from [facility]   Overall Financial Resource Strain (CARDIA)    Difficulty of Paying Living Expenses: Not hard at all  Food Insecurity: No Food Insecurity (04/16/2024)   Hunger Vital Sign    Worried About Running Out of Food in the Last Year: Never true    Ran Out of Food in the Last Year: Never true  Transportation Needs: No Transportation Needs (04/16/2024)   PRAPARE - Doctor, general practice (Medical): No    Lack of Transportation (Non-Medical): No  Physical Activity: Not on file  Stress: Not on file  Social Connections: Socially Integrated (04/16/2024)   Social Connection and Isolation Panel [NHANES]    Frequency of Communication with Friends and Family: More than three times a week    Frequency of Social Gatherings with Friends and Family: More than three times a week    Attends Religious Services: More than 4 times per year    Active Member of [employer] or Organizations: Yes    Attends Banker Meetings: 1 to 4 times per year    Marital Status: Married   History reviewed. No pertinent family history. No Known Allergies Prior to Admission medications   Medication Sig Start Date End Date Taking? Authorizing Provider  acetaminophen (TYLENOL) 325 MG tablet Take 975 mg by mouth every 6 (six) hours as needed. 03/16/24  Yes [provider]  ascorbic acid (VITAMIN C) 1000 MG tablet Take by mouth.   Yes [provider]  atorvastatin (LIPITOR) 40 MG tablet Take 20 mg by mouth at bedtime. 03/16/24  Yes [provider]  Cetirizine HCl 10 MG CAPS Take by mouth.   Yes [provider]  Cholecalciferol 25 MCG (1000 UT) capsule Take by mouth.   Yes [provider]  cyanocobalamin (VITAMIN B12) 1000 MCG tablet Take 1,000 mcg by mouth daily. 09/15/21  Yes [provider]  DULoxetine (CYMBALTA) 30 MG capsule Take 60 mg by mouth daily. 02/18/23  Yes [provider]  fluticasone (FLONASE) 50 MCG/ACT nasal spray Place 1 spray into both nostrils daily. 02/21/24  Yes [provider]  gabapentin (NEURONTIN) 100 MG capsule Take 100 mg by mouth 3 (three) times daily. Total of 500mg  a day   Yes [provider]  meloxicam (MOBIC) 15 MG tablet Take 7.5 mg by mouth 2 (two) times daily as needed. 02/21/24  Yes [provider]  Multiple Vitamin (MULTI-VITAMIN) tablet Take by mouth.   Yes [provider]  tizanidine (ZANAFLEX) 2 MG capsule TAKE 1 CAPSULE BY MOUTH 3 TIMES DAILY AS NEEDED FOR MUSCLE SPASMS. 06/08/22  Yes [provider]   DG Hip Unilat W or Wo Pelvis 2-3 Views Left Result Date: 04/15/2024 CLINICAL DATA:  Recent fall with left hip pain, initial encounter EXAM: DG HIP (WITH OR WITHOUT PELVIS) 3V LEFT COMPARISON:  None Available. FINDINGS: Pelvic ring is intact. Subcapital left femoral neck fracture is noted with impaction and angulation at the fracture site. Femoral head remains well seated. No soft tissue abnormality is noted. IMPRESSION: Subcapital left femoral neck fracture. Electronically Signed   By: Violeta Grey M.D.   On: 04/15/2024 21:05   DG Chest 1 View Result Date: 04/15/2024 CLINICAL DATA:  Known left hip fracture EXAM: PORTABLE CHEST 1 VIEW COMPARISON:  None Available. FINDINGS: Cardiac shadow is within normal limits. Lungs are well aerated bilaterally. No focal infiltrate or effusion is seen. No bony abnormality is noted. IMPRESSION: No acute abnormality noted. Electronically Signed   By: Violeta Grey M.D.   On: 04/15/2024 21:05   DG Shoulder Left Result Date: 04/15/2024 CLINICAL DATA:  Recent fall with left shoulder pain, initial encounter EXAM: LEFT SHOULDER - 2+ VIEW COMPARISON:  None Available. FINDINGS: Degenerative changes of the acromioclavicular joint are seen. No acute fracture or dislocation is noted. No soft tissue abnormality is seen. IMPRESSION: No acute abnormality noted. Electronically Signed   By: Violeta Grey M.D.   On: 04/15/2024 21:04    Positive ROS: All other systems have been reviewed and were otherwise negative with the exception of those mentioned in the HPI and as above.  Physical Exam: General: Alert, no acute distress Cardiovascular: No pedal edema Respiratory: No cyanosis, no use of accessory musculature GI: No organomegaly, abdomen is soft and non-tender Skin: No lesions in the area of chief complaint Neurologic:  Sensation intact distally Psychiatric: Patient is competent for consent with normal mood and affect Lymphatic: No axillary or cervical lymphadenopathy  MUSCULOSKELETAL: Patient is alert oriented and awake.  The left leg is slightly shortened and externally rotated.  Neurovascular status is good.  The skin is intact.  There is no bruising as yet.  Pain is present with movement of the hip.  The right lower extremity is normal to exam.  Both upper extremities have good motion without pain.  His neck does showed some discomfort with range of motion.  He has had some radicular pain in the left side prior to this injury and feels that he has a little more tingling that he did.  Assessment: Displaced subcapital fracture left hip Left cervical radiculitis  Plan: Left hip hemiarthroplasty today Further but instigation including possible MRI of the cervical spine after surgery.    Oletha Bernheim, MD [phone]   04/16/2024 9:13 AM

## 2024-04-16 NOTE — Transfer of Care (Signed)
 Immediate Anesthesia Transfer of Care Note  Patient: Alejandro Williamson  Procedure(s) Performed: HEMIARTHROPLASTY (BIPOLAR) HIP, POSTERIOR APPROACH FOR FRACTURE (Left: Hip)  Patient Location: PACU  Anesthesia Type:General  Level of Consciousness: drowsy  Airway & Oxygen Therapy: Patient Spontanous Breathing and Patient connected to face mask oxygen  Post-op Assessment: Report given to RN, Post -op Vital signs reviewed and stable, and Patient moving all extremities  Post vital signs: Reviewed and stable  Last Vitals:  Vitals Value Taken Time  BP 91/53 04/16/24 1115  Temp    Pulse 68 04/16/24 1120  Resp 12 04/16/24 1120  SpO2 100 % 04/16/24 1120  Vitals shown include unfiled device data.  Last Pain:  Vitals:   04/16/24 0758  TempSrc:   PainSc: 9       Patients Stated Pain Goal: 0 (04/15/24 1959)  Complications: No notable events documented.

## 2024-04-16 NOTE — H&P (Signed)
THE PATIENT WAS SEEN PRIOR TO SURGERY TODAY.  HISTORY, ALLERGIES, HOME MEDICATIONS AND OPERATIVE PROCEDURE WERE REVIEWED. RISKS AND BENEFITS OF SURGERY DISCUSSED WITH PATIENT AGAIN.  NO CHANGES FROM INITIAL HISTORY AND PHYSICAL NOTED.    

## 2024-04-16 NOTE — Anesthesia Procedure Notes (Signed)
 Procedure Name: Intubation Date/Time: 04/16/2024 9:15 AM  Performed by: Bobette Burrs, CRNAPre-anesthesia Checklist: Patient identified, Emergency Drugs available, Suction available and Patient being monitored Patient Re-evaluated:Patient Re-evaluated prior to induction Oxygen Delivery Method: Circle system utilized Preoxygenation: Pre-oxygenation with 100% oxygen Induction Type: IV induction Ventilation: Mask ventilation without difficulty and Oral airway inserted - appropriate to patient size Laryngoscope Size: McGrath and 3 Grade View: Grade I Tube type: Oral Tube size: 7.5 mm Number of attempts: 1 Airway Equipment and Method: Stylet, Oral airway and Bite block Placement Confirmation: ETT inserted through vocal cords under direct vision, positive ETCO2 and breath sounds checked- equal and bilateral Secured at: 22 cm Tube secured with: Tape Dental Injury: Teeth and Oropharynx as per pre-operative assessment

## 2024-04-16 NOTE — Op Note (Signed)
 04/16/2024  11:20 AM  PATIENT:  Alejandro Williamson   MRN: [id number]  PRE-OPERATIVE DIAGNOSIS:  Displaced Subcapital fracture left hip   POST-OPERATIVE DIAGNOSIS: Same  PROCEDURE: Left   hip hemiarthroplasty with Stryker Accolade prosthesis  PREOPERATIVE INDICATIONS:  Alejandro Williamson is an 73 y.o. male who was admitted 04/15/2024 with a diagnosis of displaced subcapital fracture of the hip and elected for surgical management.  The risks benefits and alternatives were discussed with the patient including but not limited to the risks of nonoperative treatment, versus surgical intervention including infection, bleeding, nerve injury, periprosthetic fracture, the need for revision surgery, dislocation, leg length discrepancy, blood clots, cardiopulmonary complications, morbidity, mortality, among others, and they were willing to proceed.  Predicted outcome is good, although there will be at least a six to nine month expected recovery.     SURGEON:  Marlynn Singer, MD  ASST:    ANESTHESIA: General Endotracheal    COMPLICATIONS:  None.   EBL: 100 cc    COMPONENTS:  Stryker Accolade Femoral Fracture stem size # 6,   and a size   53 mm  fracture head unipolar hip ball with    +4 mm  neck length.    PROCEDURE IN DETAIL: The patient was met in the holding area and identified.  The appropriate hip  was marked at the operative site. The patient was then transported to the OR and  placed under general anesthesia.  At that point, the patient was  placed in the lateral decubitus position with the operative side up and  secured to the operating room table and all bony prominences padded.     The operative lower extremity was prepped from the iliac crest to the toes.  Sterile draping was performed.  Time out was performed prior to incision.      A routine posterolateral approach was utilized via sharp dissection  carried down to the subcutaneous tissue.  Gross bleeders were Bovie  coagulated.  The iliotibial band was  identified and incised  along the length of the skin incision.  Self-retaining retractors were  inserted.  With the hip internally rotated, the short external rotators  were identified. The piriformis was tagged and the hip capsule released in a T-type fashion.  The femoral neck was exposed, and I resected the femoral neck using the appropriate jig. This was performed at approximately a thumb's breadth above the lesser trochanter.    I then exposed the deep acetabulum, cleared out any tissue including the ligamentum teres.    I then prepared the proximal femur using the cookie-cutter, the lateralizing reamer, and then sequentially broached.  A trial stem   was  utilized along with a unipolar head and neck.  I reduced the hip and it was found to have excellent stability with functional range of motion. Leg lengths were equal.  The trial components were then removed.   The same size Accolade femoral stem was then inserted and was very stable.  The Unitrax head and neck as trialed were inserted as well.     The hip was then reduced and taken through functional range of motion and found to have excellent stability. Leg lengths were restored.     I closed the T in the capsule with #2 Ticron as well as the short external rotators. A hemovac was inserted.    I then irrigated the hip copiously again with pulse lavage, and repaired the fascia with #2 Quill and the subcutaneous layer with #0 Quill. Sponge  and needle counts were correct. Dry sterile Aquacell was applied.   The patient was then awakened and returned to PACU in stable and satisfactory condition. There were no complications.  Oletha Bernheim, MD Orthopedic Surgeon [phone]   04/16/2024 11:20 AM

## 2024-04-16 NOTE — Plan of Care (Signed)
   Problem: Education: Goal: Knowledge of General Education information will improve Description Including pain rating scale, medication(s)/side effects and non-pharmacologic comfort measures Outcome: Progressing   Problem: Health Behavior/Discharge Planning: Goal: Ability to manage health-related needs will improve Outcome: Progressing

## 2024-04-16 NOTE — Assessment & Plan Note (Signed)
Will continue Neurontin.

## 2024-04-16 NOTE — Progress Notes (Signed)
 PROGRESS NOTE    Castor Press  URK:[id number] DOB: [birth year] DOA: 04/15/2024 PCP: Alejandro Banter, MD    Brief Narrative:  73 y.o. male with medical history significant for peripheral neuropathy, depression, dyslipidemia and GERD, who presented to the emergency room with acute onset of mechanical fall with subsequent left hip pain.  The patient apparently tripped and fell while getting up and losing balance.  Denies any headache or dizziness or blurred vision.  No paresthesias or focal muscle weakness.  No chest pain or palpitations.  No tinnitus or vertigo.  No cough or wheezing or dyspnea.  No fever or chills.  No dysuria, oliguria or hematuria or flank pain.  No bleeding diathesis.    Assessment & Plan:   Principal Problem:   Closed left hip fracture, initial encounter [facility]) Active Problems:   Dyslipidemia   Peripheral neuropathy  Closed left hip fracture, initial encounter (HCC) Status post mechanical fall.  No evidence of syncopal event.  No head trauma.  X-rays revealed displaced subcapital fracture of left hip.  Patient overall in good health with good functional status. Plan: OR for left hip hemiarthroplasty today Pain management DVT prophylaxis to start postoperative day 1 PT OT to start postoperative day 1  Peripheral neuropathy PTA Neurontin   Dyslipidemia PTA statin     DVT prophylaxis: On hold for OR.  Start subcu Lovenox 5/5 Code Status: Full Family Communication:None Disposition Plan: Status is: Inpatient Remains inpatient appropriate because: Hip fracture, operative repair 5/4   Level of care: Med-Surg  Consultants:  Orthopedics  Procedures:  Left hip hemiarthroplasty 5/4  Antimicrobials: None    Subjective: Seen and examined.  POD#0.  Pain well controlled post op.  C/o pain in neck radiating down left arm  Objective: Vitals:   04/16/24 0538 04/16/24 1115 04/16/24 1130 04/16/24 1145  BP: 134/74 (!) 91/53 106/62 (!) 105/59  Pulse: 68 68 70  68  Resp: 18 17 15 15   Temp: 98.9 F (37.2 C) 97.7 F (36.5 C)    TempSrc: Oral     SpO2:  100% 100% 97%  Weight:      Height:        Intake/Output Summary (Last 24 hours) at 04/16/2024 1205 Last data filed at 04/16/2024 1105 Gross per 24 hour  Intake 1246.35 ml  Output 1000 ml  Net 246.35 ml   Filed Weights   04/15/24 2000  Weight: 94.3 kg    Examination:  General exam: Appears calm and comfortable  Respiratory system: Clear to auscultation. Respiratory effort normal. Cardiovascular system: S1S2, RRR, no murmur Gastrointestinal system: Soft NTND, normal BS Central nervous system: Alert and oriented. No focal neurological deficits. Extremities: LLE immobilized.  No pain on palpation Skin: No rashes, lesions or ulcers Psychiatry: Judgement and insight appear normal. Mood & affect appropriate.     Data Reviewed: I have personally reviewed following labs and imaging studies  CBC: Recent Labs  Lab 04/16/24 0238  WBC 10.9*  NEUTROABS 7.5  HGB 13.0  HCT 39.3  MCV 87.1  PLT 187   Basic Metabolic Panel: Recent Labs  Lab 04/15/24 2114 04/16/24 0238  NA 138 140  K 4.0 3.6  CL 105 109  CO2 24 24  GLUCOSE 94 102*  BUN 14 13  CREATININE 0.91 0.89  CALCIUM 9.0 8.7*   GFR: Estimated Creatinine Clearance: 83.9 mL/min (by C-G formula based on SCr of 0.89 mg/dL). Liver Function Tests: Recent Labs  Lab 04/15/24 2114  AST 27  ALT 27  ALKPHOS  42  BILITOT 0.7  PROT 7.0  ALBUMIN 4.2   No results for input(s): "LIPASE", "AMYLASE" in the last 168 hours. No results for input(s): "AMMONIA" in the last 168 hours. Coagulation Profile: Recent Labs  Lab 04/15/24 2342  INR 1.1   Cardiac Enzymes: No results for input(s): "CKTOTAL", "CKMB", "CKMBINDEX", "TROPONINI" in the last 168 hours. BNP (last 3 results) No results for input(s): "PROBNP" in the last 8760 hours. HbA1C: No results for input(s): "HGBA1C" in the last 72 hours. CBG: No results for input(s):  "GLUCAP" in the last 168 hours. Lipid Profile: No results for input(s): "CHOL", "HDL", "LDLCALC", "TRIG", "CHOLHDL", "LDLDIRECT" in the last 72 hours. Thyroid Function Tests: No results for input(s): "TSH", "T4TOTAL", "FREET4", "T3FREE", "THYROIDAB" in the last 72 hours. Anemia Panel: No results for input(s): "VITAMINB12", "FOLATE", "FERRITIN", "TIBC", "IRON", "RETICCTPCT" in the last 72 hours. Sepsis Labs: No results for input(s): "PROCALCITON", "LATICACIDVEN" in the last 168 hours.  No results found for this or any previous visit (from the past 240 hours).       Radiology Studies: DG Hip Unilat W or Wo Pelvis 2-3 Views Left Result Date: 04/15/2024 CLINICAL DATA:  Recent fall with left hip pain, initial encounter EXAM: DG HIP (WITH OR WITHOUT PELVIS) 3V LEFT COMPARISON:  None Available. FINDINGS: Pelvic ring is intact. Subcapital left femoral neck fracture is noted with impaction and angulation at the fracture site. Femoral head remains well seated. No soft tissue abnormality is noted. IMPRESSION: Subcapital left femoral neck fracture. Electronically Signed   By: Violeta Grey M.D.   On: 04/15/2024 21:05   DG Chest 1 View Result Date: 04/15/2024 CLINICAL DATA:  Known left hip fracture EXAM: PORTABLE CHEST 1 VIEW COMPARISON:  None Available. FINDINGS: Cardiac shadow is within normal limits. Lungs are well aerated bilaterally. No focal infiltrate or effusion is seen. No bony abnormality is noted. IMPRESSION: No acute abnormality noted. Electronically Signed   By: Violeta Grey M.D.   On: 04/15/2024 21:05   DG Shoulder Left Result Date: 04/15/2024 CLINICAL DATA:  Recent fall with left shoulder pain, initial encounter EXAM: LEFT SHOULDER - 2+ VIEW COMPARISON:  None Available. FINDINGS: Degenerative changes of the acromioclavicular joint are seen. No acute fracture or dislocation is noted. No soft tissue abnormality is seen. IMPRESSION: No acute abnormality noted. Electronically Signed   By: Violeta Grey M.D.   On: 04/15/2024 21:04        Scheduled Meds:  [MAR Hold] ascorbic acid  1,000 mg Oral Daily   [MAR Hold] cholecalciferol  1,000 Units Oral Daily   [MAR Hold] gabapentin  200 mg Oral BID   [MAR Hold] loratadine  10 mg Oral Daily   [MAR Hold] multivitamin with minerals  1 tablet Oral Daily   Continuous Infusions:  sodium chloride Stopped (04/15/24 2324)   sodium chloride 100 mL/hr at 04/16/24 0020     LOS: 1 day      Tiajuana Fluke, MD [facility]   If 7PM-7AM, please contact night-coverage  04/16/2024, 12:05 PM

## 2024-04-16 NOTE — Anesthesia Postprocedure Evaluation (Signed)
 Anesthesia Post Note  Patient: Alejandro Williamson  Procedure(s) Performed: HEMIARTHROPLASTY (BIPOLAR) HIP, POSTERIOR APPROACH FOR FRACTURE (Left: Hip)  Patient location during evaluation: PACU Anesthesia Type: General Level of consciousness: awake and alert Pain management: pain level controlled Vital Signs Assessment: post-procedure vital signs reviewed and stable Respiratory status: spontaneous breathing, nonlabored ventilation, respiratory function stable and patient connected to nasal cannula oxygen Cardiovascular status: blood pressure returned to baseline and stable Postop Assessment: no apparent nausea or vomiting Anesthetic complications: no   No notable events documented.   Last Vitals:  Vitals:   04/16/24 1200 04/16/24 1230  BP: 123/68 118/68  Pulse: 61 62  Resp: 13 16  Temp: (!) 36.3 C 36.6 C  SpO2: 96% 98%    Last Pain:  Vitals:   04/16/24 1200  TempSrc:   PainSc: 0-No pain                 Portia Brittle Zayden Maffei

## 2024-04-17 ENCOUNTER — Encounter: Payer: Self-pay | Admitting: Family Medicine

## 2024-04-17 DIAGNOSIS — S72002A Fracture of unspecified part of neck of left femur, initial encounter for closed fracture: Secondary | ICD-10-CM | POA: Diagnosis not present

## 2024-04-17 LAB — BASIC METABOLIC PANEL WITH GFR
Anion gap: 9 (ref 5–15)
BUN: 16 mg/dL (ref 8–23)
CO2: 25 mmol/L (ref 22–32)
Calcium: 8.9 mg/dL (ref 8.9–10.3)
Chloride: 107 mmol/L (ref 98–111)
Creatinine, Ser: 1.23 mg/dL (ref 0.61–1.24)
GFR, Estimated: >60 mL/min (ref >60)
Glucose, Bld: 154 mg/dL — ABNORMAL HIGH (ref 70–99)
Potassium: 4.6 mmol/L (ref 3.5–5.1)
Sodium: 141 mmol/L (ref 135–145)

## 2024-04-17 LAB — CBC
HCT: 35.9 % — ABNORMAL LOW (ref 39.0–52.0)
Hemoglobin: 11.4 g/dL — ABNORMAL LOW (ref 13.0–17.0)
MCH: 28.3 pg (ref 26.0–34.0)
MCHC: 31.8 g/dL (ref 30.0–36.0)
MCV: 89.1 fL (ref 80.0–100.0)
Platelets: 183 10*3/uL (ref 150–400)
RBC: 4.03 MIL/uL — ABNORMAL LOW (ref 4.22–5.81)
RDW: 14.3 % (ref 11.5–15.5)
WBC: 13.4 10*3/uL — ABNORMAL HIGH (ref 4.0–10.5)
nRBC: 0 % (ref 0.0–0.2)

## 2024-04-17 MED ORDER — GABAPENTIN 300 MG PO CAPS
300.0000 mg | ORAL_CAPSULE | Freq: Three times a day (TID) | ORAL | Status: DC
  Administered 2024-04-17 – 2024-04-19 (×6): 300 mg via ORAL
  Filled 2024-04-17 (×8): qty 1

## 2024-04-17 NOTE — Progress Notes (Signed)
 Patient asked to ambulate in the morning instead of at night.

## 2024-04-17 NOTE — Progress Notes (Signed)
 Initial Nutrition Assessment  DOCUMENTATION CODES:   Not applicable  INTERVENTION:   -Continue regular diet -MVI with minerals daily  NUTRITION DIAGNOSIS:   Increased nutrient needs related to post-op healing as evidenced by estimated needs.  GOAL:   Patient will meet greater than or equal to 90% of their needs  MONITOR:   PO intake  REASON FOR ASSESSMENT:   Consult Assessment of nutrition requirement/status, Hip fracture protocol  ASSESSMENT:   Pt with medical history significant for peripheral neuropathy, depression, dyslipidemia and GERD, who presented to the emergency room with acute onset of mechanical fall with subsequent left hip pain.  Pt admitted with lt hip fracture.   5/4- s/p Left hip hemiarthroplasty with Stryker Accolade prosthesis   Reviewed I/O's: -350 ml x 24 hours and -704 ml since admission  UOP: 1.5 L x 24 hours  Spoke with pt, who was sitting in recliner chair at time of visit. Pt just finished work with acute rehab services. Pt pleasant and in good spirits today and reports feeling well. Pt just finished breakfast, which he consumed 100%.   PTA, pt with good appetite and consumed 3 meals per day. Pt denies any concerns regarding nutrition.   Pt denies any weight loss. His UBW is around 205#.   Discussed importance of good meal intake to support healing. Pt expressed appreciation for visit.    Medications reviewed and include vitamin C, vitamin D3, colace, ferrous sulfate, and neurontin.   Labs reviewed.    NUTRITION - FOCUSED PHYSICAL EXAM:  Flowsheet Row Most Recent Value  Orbital Region No depletion  Upper Arm Region No depletion  Thoracic and Lumbar Region No depletion  Buccal Region No depletion  Temple Region No depletion  Clavicle Bone Region No depletion  Clavicle and Acromion Bone Region No depletion  Scapular Bone Region No depletion  Dorsal Hand No depletion  Patellar Region No depletion  Anterior Thigh Region No  depletion  Posterior Calf Region No depletion  Edema (RD Assessment) None  Hair Reviewed  Eyes Reviewed  Mouth Reviewed  Skin Reviewed  Nails Reviewed       Diet Order:   Diet Order             Diet regular Room service appropriate? Yes; Fluid consistency: Thin  Diet effective now                   EDUCATION NEEDS:   Education needs have been addressed  Skin:  Skin Assessment: Skin Integrity Issues: Skin Integrity Issues:: Incisions Incisions: closed lt hip  Last BM:  04/15/24  Height:   Ht Readings from Last 1 Encounters:  04/15/24 5\' 10"  (1.778 m)    Weight:   Wt Readings from Last 1 Encounters:  04/15/24 94.3 kg    Ideal Body Weight:  75.5 kg  BMI:  Body mass index is 29.84 kg/m.  Estimated Nutritional Needs:   Kcal:  2150-2350  Protein:  115-130 grams  Fluid:  2.0-2.2 L    Herschel Lords, RD, LDN, CDCES Registered Dietitian III Certified Diabetes Care and Education Specialist If unable to reach this RD, please use "RD Inpatient" group chat on secure chat between hours of 8am-4 pm daily

## 2024-04-17 NOTE — Evaluation (Signed)
 Physical Therapy Evaluation Patient Details Name: Alejandro Williamson MRN: [id number] DOB: October 15, 1951 Today's Date: 04/17/2024  History of Present Illness  Pt admitted for L hip fx and is s/p L hip hemi on 04/16/24 secondary to recent fall. History includes neuropathy, depression, and GERD.  Clinical Impression  Pt is a pleasant 73 year old male who was admitted for L hip fx and is s/p hip hemi. Educated on post hip precautions. Pt performs bed mobility/transfers with min assist and ambulation with cga and RW. Pt demonstrates deficits with strength/mobility/pain. Pt requesting pain meds after session, RN notified. Would benefit from additional PT session next date for stair training. Would benefit from skilled PT to address above deficits and promote optimal return to PLOF. Pt will continue to receive skilled PT services while admitted and will defer to TOC/care team for updates regarding disposition planning.         If plan is discharge home, recommend the following: A little help with walking and/or transfers;Help with stairs or ramp for entrance;A little help with bathing/dressing/bathroom   Can travel by private vehicle        Equipment Recommendations Rolling walker (2 wheels);BSC/3in1  Recommendations for Other Services       Functional Status Assessment Patient has had a recent decline in their functional status and demonstrates the ability to make significant improvements in function in a reasonable and predictable amount of time.     Precautions / Restrictions Precautions Precautions: Posterior Hip;Fall Precaution Booklet Issued: No Recall of Precautions/Restrictions: Intact Restrictions Weight Bearing Restrictions Per Provider Order: Yes LLE Weight Bearing Per Provider Order: Weight bearing as tolerated      Mobility  Bed Mobility Overal bed mobility: Needs Assistance Bed Mobility: Supine to Sit     Supine to sit: Min assist     General bed mobility comments: needs cues  for hip precautions and step by step commands. Once seated, upright posture noted    Transfers Overall transfer level: Needs assistance Equipment used: Rolling walker (2 wheels) Transfers: Sit to/from Stand Sit to Stand: Min assist           General transfer comment: needs bed slightly elevated for transfer. Upright posture and no dizziness    Ambulation/Gait Ambulation/Gait assistance: Contact guard assist Gait Distance (Feet): 200 Feet Assistive device: Rolling walker (2 wheels) Gait Pattern/deviations: Step-through pattern       General Gait Details: ambulated around RN station with reciprocal gait pattern. Good posture and able to look forward.  Stairs            Wheelchair Mobility     Tilt Bed    Modified Rankin (Stroke Patients Only)       Balance Overall balance assessment: Needs assistance Sitting-balance support: Feet supported Sitting balance-Leahy Scale: Good     Standing balance support: Bilateral upper extremity supported Standing balance-Leahy Scale: Fair                               Pertinent Vitals/Pain Pain Assessment Pain Assessment: 0-10 Pain Score: 5  Pain Location: L hip Pain Descriptors / Indicators: Operative site guarding Pain Intervention(s): Limited activity within patient's tolerance, Repositioned, Patient requesting pain meds-RN notified    Home Living Family/patient expects to be discharged to:: Private residence Living Arrangements: Spouse/significant other Available Help at Discharge: Family;Available 24 hours/day Type of Home: House Home Access: Stairs to enter Entrance Stairs-Rails: Can reach both Entrance Stairs-Number of Steps: 3 Alternate  Level Stairs-Number of Steps: flight Home Layout: Two level;Able to live on main level with bedroom/bathroom Home Equipment: None      Prior Function Prior Level of Function : Independent/Modified Independent;Driving             Mobility Comments: very  active, walks over 10,000 steps daily ADLs Comments: indep     Extremity/Trunk Assessment   Upper Extremity Assessment Upper Extremity Assessment: Overall WFL for tasks assessed    Lower Extremity Assessment Lower Extremity Assessment: Generalized weakness (L LE grossly 3/5)       Communication   Communication Communication: No apparent difficulties    Cognition Arousal: Alert Behavior During Therapy: WFL for tasks assessed/performed   PT - Cognitive impairments: No apparent impairments                       PT - Cognition Comments: pleasant and agreeable to session Following commands: Intact       Cueing Cueing Techniques: Verbal cues     General Comments      Exercises Other Exercises Other Exercises: SLR, AP, and QS x 5-10 reps. Cues for education and hip precautions   Assessment/Plan    PT Assessment Patient needs continued PT services  PT Problem List Decreased strength;Decreased balance;Decreased mobility;Decreased knowledge of use of DME;Decreased knowledge of precautions;Pain       PT Treatment Interventions DME instruction;Gait training;Stair training;Therapeutic exercise;Balance training    PT Goals (Current goals can be found in the Care Plan section)  Acute Rehab PT Goals Patient Stated Goal: to go home PT Goal Formulation: With patient Time For Goal Achievement: 05/01/24 Potential to Achieve Goals: Good    Frequency 7X/week     Co-evaluation               AM-PAC PT "6 Clicks" Mobility  Outcome Measure Help needed turning from your back to your side while in a flat bed without using bedrails?: A Little Help needed moving from lying on your back to sitting on the side of a flat bed without using bedrails?: A Little Help needed moving to and from a bed to a chair (including a wheelchair)?: A Little Help needed standing up from a chair using your arms (e.g., wheelchair or bedside chair)?: A Little Help needed to walk in  hospital room?: A Little Help needed climbing 3-5 steps with a railing? : A Little 6 Click Score: 18    End of Session Equipment Utilized During Treatment: Gait belt Activity Tolerance: Patient tolerated treatment well Patient left: in chair Nurse Communication: Mobility status PT Visit Diagnosis: Muscle weakness (generalized) (M62.81);Difficulty in walking, not elsewhere classified (R26.2);Pain Pain - Right/Left: Left Pain - part of body: Hip    Time: 5621-3086 PT Time Calculation (min) (ACUTE ONLY): 42 min   Charges:   PT Evaluation $PT Eval Low Complexity: 1 Low PT Treatments $Gait Training: 8-22 mins $Therapeutic Exercise: 8-22 mins PT General Charges $$ ACUTE PT VISIT: 1 Visit         Amparo Balk, PT, DPT, GCS [phone]   Anzleigh Slaven 04/17/2024, 11:33 AM

## 2024-04-17 NOTE — Progress Notes (Signed)
 PROGRESS NOTE    Alejandro Williamson  FAO:[id number] DOB: 03-Jan-1951 DOA: 04/15/2024 PCP: Nestor Banter, MD    Brief Narrative:  73 y.o. male with medical history significant for peripheral neuropathy, depression, dyslipidemia and GERD, who presented to the emergency room with acute onset of mechanical fall with subsequent left hip pain.  The patient apparently tripped and fell while getting up and losing balance.  Denies any headache or dizziness or blurred vision.  No paresthesias or focal muscle weakness.  No chest pain or palpitations.  No tinnitus or vertigo.  No cough or wheezing or dyspnea.  No fever or chills.  No dysuria, oliguria or hematuria or flank pain.  No bleeding diathesis.    Assessment & Plan:   Principal Problem:   Closed left hip fracture, initial encounter [facility]) Active Problems:   Dyslipidemia   Peripheral neuropathy  Closed left hip fracture, initial encounter (HCC) Status post mechanical fall.  No evidence of syncopal event.  No head trauma.  X-rays revealed displaced subcapital fracture of left hip.  Patient overall in good health with good functional status.  Status post left hip hemiarthroplasty 5/4 Plan: DVT prophylaxis to start today PT OT to start today Pain control Bowel regimen  C67 stenosis Chronic issue.  Pain was worsened after mechanical fall.  Repeat MRI redemonstrates known severe canal stenosis at C6-C7. Plan: Increase gabapentin to 300 mg 3 times daily PT OT  Peripheral neuropathy Increase dose of Neurontin   Dyslipidemia PTA statin     DVT prophylaxis: SQ Lovenox Code Status: Full Family Communication: Spouse at bedside 5/4 Disposition Plan: Status is: Inpatient Remains inpatient appropriate because: Hip fracture, operative repair 5/4   Level of care: Med-Surg  Consultants:  Orthopedics  Procedures:  Left hip hemiarthroplasty 5/4  Antimicrobials: None    Subjective: Seen and examined.  POD #1.  Pain  well-controlled.  Objective: Vitals:   04/16/24 1445 04/16/24 1951 04/17/24 0441 04/17/24 0844  BP: 105/65 133/83 123/72 134/78  Pulse: 73 86 74 73  Resp: 16 18 18 18   Temp: 98.7 F (37.1 C) 98 F (36.7 C)    TempSrc:  Oral    SpO2: 97% 97% 97% 97%  Weight:      Height:        Intake/Output Summary (Last 24 hours) at 04/17/2024 1023 Last data filed at 04/16/2024 2000 Gross per 24 hour  Intake 900 ml  Output 1050 ml  Net -150 ml   Filed Weights   04/15/24 2000  Weight: 94.3 kg    Examination:  General exam: NAD Respiratory system: Lungs clear.  Normal breathing.  Room air Cardiovascular system: S1S2, RRR, no murmur Gastrointestinal system: Soft NTND, normal BS Central nervous system: Alert and oriented. No focal neurological deficits. Extremities: LLE immobilized.  No pain on palpation Skin: No rashes, lesions or ulcers Psychiatry: Judgement and insight appear normal. Mood & affect appropriate.     Data Reviewed: I have personally reviewed following labs and imaging studies  CBC: Recent Labs  Lab 04/16/24 0238 04/17/24 0326  WBC 10.9* 13.4*  NEUTROABS 7.5  --   HGB 13.0 11.4*  HCT 39.3 35.9*  MCV 87.1 89.1  PLT 187 183   Basic Metabolic Panel: Recent Labs  Lab 04/15/24 2114 04/16/24 0238 04/17/24 0326  NA 138 140 141  K 4.0 3.6 4.6  CL 105 109 107  CO2 24 24 25   GLUCOSE 94 102* 154*  BUN 14 13 16   CREATININE 0.91 0.89 1.23  CALCIUM 9.0  8.7* 8.9   GFR: Estimated Creatinine Clearance: 60.7 mL/min (by C-G formula based on SCr of 1.23 mg/dL). Liver Function Tests: Recent Labs  Lab 04/15/24 2114  AST 27  ALT 27  ALKPHOS 42  BILITOT 0.7  PROT 7.0  ALBUMIN 4.2   No results for input(s): "LIPASE", "AMYLASE" in the last 168 hours. No results for input(s): "AMMONIA" in the last 168 hours. Coagulation Profile: Recent Labs  Lab 04/15/24 2342  INR 1.1   Cardiac Enzymes: No results for input(s): "CKTOTAL", "CKMB", "CKMBINDEX", "TROPONINI" in  the last 168 hours. BNP (last 3 results) No results for input(s): "PROBNP" in the last 8760 hours. HbA1C: No results for input(s): "HGBA1C" in the last 72 hours. CBG: No results for input(s): "GLUCAP" in the last 168 hours. Lipid Profile: No results for input(s): "CHOL", "HDL", "LDLCALC", "TRIG", "CHOLHDL", "LDLDIRECT" in the last 72 hours. Thyroid Function Tests: No results for input(s): "TSH", "T4TOTAL", "FREET4", "T3FREE", "THYROIDAB" in the last 72 hours. Anemia Panel: No results for input(s): "VITAMINB12", "FOLATE", "FERRITIN", "TIBC", "IRON", "RETICCTPCT" in the last 72 hours. Sepsis Labs: No results for input(s): "PROCALCITON", "LATICACIDVEN" in the last 168 hours.  No results found for this or any previous visit (from the past 240 hours).       Radiology Studies: MR CERVICAL SPINE WO CONTRAST Result Date: 04/17/2024 CLINICAL DATA:  Myelopathy, acute, cervical spine EXAM: MRI CERVICAL SPINE WITHOUT CONTRAST TECHNIQUE: Multiplanar, multisequence MR imaging of the cervical spine was performed. No intravenous contrast was administered. COMPARISON:  None Available. FINDINGS: Alignment: No substantial sagittal subluxation. Vertebrae: No fracture, evidence of discitis, or bone lesion. Cord: Normal cord signal. Posterior Fossa, vertebral arteries, paraspinal tissues: Visualized vertebral artery flow voids are maintained. No paraspinal edema. Disc levels: C2-C3: No significant disc protrusion, foraminal stenosis, or canal stenosis. C3-C4: Posterior disc osteophyte complex with mild facet and uncovertebral hypertrophy. Resulting mild canal stenosis and mild bilateral foraminal stenosis. C4-C5: Posterior disc osteophyte complex with mild facet and uncovertebral hypertrophy. Resulting mild right foraminal stenosis. Patent canal and left foramen. C5-C6: Posterior disc osteophyte complex with mild right facet of vertebral hypertrophy. Resulting mild canal and right foraminal stenosis. Patent left  foramen. C6-C7: Posterior disc osteophyte complex with left greater than right facet and uncovertebral hypertrophy. Resulting severe left foraminal stenosis and mild right foraminal stenosis. Mild canal stenosis. C7-T1: No significant disc protrusion, foraminal stenosis, or canal stenosis. IMPRESSION: 1. At C6-C7, severe left foraminal stenosis. Mild canal and right foraminal stenosis at this level. 2. Additional multilevel mild canal and foraminal stenosis is detailed above. Electronically Signed   By: Stevenson Elbe M.D.   On: 04/17/2024 02:00   DG HIP UNILAT W OR W/O PELVIS 2-3 VIEWS LEFT Result Date: 04/16/2024 CLINICAL DATA:  Postop left hip hemiarthroplasty for femoral neck fracture. EXAM: DG HIP (WITH OR WITHOUT PELVIS) 2-3V LEFT COMPARISON:  Radiographs 04/15/2024. FINDINGS: Interval left hip bipolar hemiarthroplasty. The hardware is well positioned. There is no evidence of acute fracture or dislocation. There is gas within the left hip joint and surrounding soft tissues. Skin staples are in place. Sacral spinal stimulator leads are partially imaged. IMPRESSION: Interval left hip bipolar hemiarthroplasty without evidence of immediate postoperative complication. Electronically Signed   By: Elmon Hagedorn M.D.   On: 04/16/2024 13:36   DG Hip Unilat W or Wo Pelvis 2-3 Views Left Result Date: 04/15/2024 CLINICAL DATA:  Recent fall with left hip pain, initial encounter EXAM: DG HIP (WITH OR WITHOUT PELVIS) 3V LEFT COMPARISON:  None Available.  FINDINGS: Pelvic ring is intact. Subcapital left femoral neck fracture is noted with impaction and angulation at the fracture site. Femoral head remains well seated. No soft tissue abnormality is noted. IMPRESSION: Subcapital left femoral neck fracture. Electronically Signed   By: Violeta Grey M.D.   On: 04/15/2024 21:05   DG Chest 1 View Result Date: 04/15/2024 CLINICAL DATA:  Known left hip fracture EXAM: PORTABLE CHEST 1 VIEW COMPARISON:  None Available.  FINDINGS: Cardiac shadow is within normal limits. Lungs are well aerated bilaterally. No focal infiltrate or effusion is seen. No bony abnormality is noted. IMPRESSION: No acute abnormality noted. Electronically Signed   By: Violeta Grey M.D.   On: 04/15/2024 21:05   DG Shoulder Left Result Date: 04/15/2024 CLINICAL DATA:  Recent fall with left shoulder pain, initial encounter EXAM: LEFT SHOULDER - 2+ VIEW COMPARISON:  None Available. FINDINGS: Degenerative changes of the acromioclavicular joint are seen. No acute fracture or dislocation is noted. No soft tissue abnormality is seen. IMPRESSION: No acute abnormality noted. Electronically Signed   By: Violeta Grey M.D.   On: 04/15/2024 21:04        Scheduled Meds:  ascorbic acid  1,000 mg Oral Daily   cholecalciferol  1,000 Units Oral Daily   docusate sodium  100 mg Oral BID   enoxaparin (LOVENOX) injection  30 mg Subcutaneous Q24H   ferrous sulfate  325 mg Oral Q breakfast   gabapentin  200 mg Oral BID   loratadine  10 mg Oral Daily   multivitamin with minerals  1 tablet Oral Daily   Continuous Infusions:  sodium chloride Stopped (04/15/24 2324)     LOS: 2 days      Tiajuana Fluke, MD [facility]   If 7PM-7AM, please contact night-coverage  04/17/2024, 10:23 AM

## 2024-04-17 NOTE — Plan of Care (Signed)
   Problem: Education: Goal: Knowledge of General Education information will improve Description: Including pain rating scale, medication(s)/side effects and non-pharmacologic comfort measures Outcome: Progressing   Problem: Clinical Measurements: Goal: Ability to maintain clinical measurements within normal limits will improve Outcome: Progressing   Problem: Clinical Measurements: Goal: Respiratory complications will improve Outcome: Progressing

## 2024-04-17 NOTE — Progress Notes (Signed)
 Subjective: 1 Day Post-Op Procedure(s) (LRB): HEMIARTHROPLASTY (BIPOLAR) HIP, POSTERIOR APPROACH FOR FRACTURE (Left) Patient is out of bed eating lunch.  He is awake and alert and comfortable.  His wife is present.  Hemoglobin is stable at 11.4.  Dressing is dry.  The hip is stable.  He is walked a few steps.  He should be able to go home with home health PT in 2 to 3 days.  Will be discharged on 81 mg aspirin twice daily for 6 to 8 weeks.  He may be weightbearing as tolerated with a walker.  Patient reports pain as mild.  Objective:   VITALS:   Vitals:   04/17/24 0441 04/17/24 0844  BP: 123/72 134/78  Pulse: 74 73  Resp: 18 18  Temp:    SpO2: 97% 97%    Neurologically intact Sensation intact distally Dorsiflexion/Plantar flexion intact Incision: dressing C/D/I  LABS Recent Labs    04/16/24 0238 04/17/24 0326  HGB 13.0 11.4*  HCT 39.3 35.9*  WBC 10.9* 13.4*  PLT 187 183    Recent Labs    04/15/24 2114 04/16/24 0238 04/17/24 0326  NA 138 140 141  K 4.0 3.6 4.6  BUN 14 13 16   CREATININE 0.91 0.89 1.23  GLUCOSE 94 102* 154*    Recent Labs    04/15/24 2342  INR 1.1     Assessment/Plan: 1 Day Post-Op Procedure(s) (LRB): HEMIARTHROPLASTY (BIPOLAR) HIP, POSTERIOR APPROACH FOR FRACTURE (Left)   Advance diet Up with therapy Discharge home with home health Weightbearing as tolerated with walker left leg 81 mg ASA twice daily for 6 to 8 weeks post discharge Follow-up in my office 2 weeks after discharge

## 2024-04-17 NOTE — Evaluation (Signed)
 Occupational Therapy Evaluation Patient Details Name: Alejandro Williamson MRN: [id number] DOB: 1951-11-24 Today's Date: 04/17/2024   History of Present Illness   Pt admitted for L hip fx and is s/p L hip hemi on 04/16/24 secondary to recent fall. History includes neuropathy, depression, and GERD.     Clinical Impressions Pt was seen for OT evaluation this date. PTA, pt was living at home with his wife where he was IND without AD use, very active and IND walking 10,000 steps per day. Pt presents to acute OT demonstrating impaired ADL performance and functional mobility 2/2 weakness, pain, and balance deficits. He reports 2-3/10 pain described as soreness to L hip with activity. Pt currently requires Min A for LB dressing to don pants using reacher. Min A for safety with STS, cueing for forward scoot prior to standing. CGA for mobility ~170 feet using RW. Mild dizziness with initial stand that resolved quickly. Edu pt and wife on hip precautions and provided written handout. Edu on all AE/AD/DME to utilize during ADL performance to maximize safety, IND and carryover of hip precautions. Pt and wife verbalized understanding.  Pt would benefit from skilled OT services to address noted impairments and functional limitations to maximize safety and independence while minimizing falls risk and caregiver burden. Do anticipate the need for follow up OT services upon acute hospital DC.      If plan is discharge home, recommend the following:   A little help with walking and/or transfers;A little help with bathing/dressing/bathroom;Assistance with cooking/housework;Help with stairs or ramp for entrance;Assist for transportation     Functional Status Assessment   Patient has had a recent decline in their functional status and demonstrates the ability to make significant improvements in function in a reasonable and predictable amount of time.     Equipment Recommendations   BSC/3in1     Recommendations for  Other Services         Precautions/Restrictions   Precautions Precautions: Posterior Hip;Fall Precaution Booklet Issued: Yes (comment) Recall of Precautions/Restrictions: Intact Restrictions Weight Bearing Restrictions Per Provider Order: Yes LLE Weight Bearing Per Provider Order: Weight bearing as tolerated     Mobility Bed Mobility               General bed mobility comments: NT in recliner pre/post session    Transfers Overall transfer level: Needs assistance Equipment used: Rolling walker (2 wheels) Transfers: Sit to/from Stand Sit to Stand: Min assist, Contact guard assist           General transfer comment: Min/CGA for safety with STS from recliner; cueing for forward scoot prior to stand      Balance Overall balance assessment: Needs assistance Sitting-balance support: Feet supported Sitting balance-Leahy Scale: Good     Standing balance support: Bilateral upper extremity supported Standing balance-Leahy Scale: Fair Standing balance comment: RW and CGA                           ADL either performed or assessed with clinical judgement   ADL Overall ADL's : Needs assistance/impaired                     Lower Body Dressing: With adaptive equipment;Cueing for safety;Sitting/lateral leans;Sit to/from stand;Minimal assistance Lower Body Dressing Details (indicate cue type and reason): using reach to don shorts             Functional mobility during ADLs: Contact guard assist;Rolling walker (2 wheels)  Vision         Perception         Praxis         Pertinent Vitals/Pain Pain Assessment Pain Assessment: 0-10 Pain Score: 3  Pain Location: L hip Pain Descriptors / Indicators: Operative site guarding Pain Intervention(s): Limited activity within patient's tolerance, Monitored during session, Repositioned     Extremity/Trunk Assessment Upper Extremity Assessment Upper Extremity Assessment: Overall WFL for  tasks assessed   Lower Extremity Assessment Lower Extremity Assessment: Generalized weakness (s/p L hip hemi)       Communication Communication Communication: No apparent difficulties   Cognition Arousal: Alert Behavior During Therapy: WFL for tasks assessed/performed                                 Following commands: Intact       Cueing  General Comments   Cueing Techniques: Verbal cues  dizziness with inital stand that subsided quickly   Exercises Other Exercises Other Exercises: Edu role of OT in acute setting, DME/AD/AE needs to maximize IND/safety with ADL performance, and provided written handout with post hip precautions listed.   Shoulder Instructions      Home Living Family/patient expects to be discharged to:: Private residence Living Arrangements: Spouse/significant other Available Help at Discharge: Family;Available 24 hours/day Type of Home: House Home Access: Stairs to enter [employer] of Steps: 3 Entrance Stairs-Rails: Can reach both Home Layout: Two level;Able to live on main level with bedroom/bathroom Alternate Level Stairs-Number of Steps: flight   Bathroom Shower/Tub: Walk-in shower         Home Equipment: Shower seat;Hand held shower head          Prior Functioning/Environment Prior Level of Function : Independent/Modified Independent;Driving             Mobility Comments: very active, walks over 10,000 steps daily ADLs Comments: indep    OT Problem List: Decreased strength;Impaired balance (sitting and/or standing)   OT Treatment/Interventions: Self-care/ADL training;Balance training;Therapeutic exercise;Therapeutic activities;DME and/or AE instruction;Patient/family education      OT Goals(Current goals can be found in the care plan section)   Acute Rehab OT Goals Patient Stated Goal: return home OT Goal Formulation: With patient Time For Goal Achievement: 05/01/24 Potential to Achieve Goals:  Good ADL Goals Pt Will Perform Lower Body Bathing: with adaptive equipment;sitting/lateral leans;sit to/from stand;with supervision Pt Will Perform Lower Body Dressing: with supervision;with adaptive equipment;sitting/lateral leans;sit to/from stand Pt Will Transfer to Toilet: with contact guard assist;bedside commode;regular height toilet;ambulating;grab bars Pt Will Perform Toileting - Clothing Manipulation and hygiene: with supervision;sit to/from stand;sitting/lateral leans   OT Frequency:  Min 2X/week    Co-evaluation              AM-PAC OT "6 Clicks" Daily Activity     Outcome Measure Help from another person eating meals?: None Help from another person taking care of personal grooming?: None Help from another person toileting, which includes using toliet, bedpan, or urinal?: A Little Help from another person bathing (including washing, rinsing, drying)?: A Little Help from another person to put on and taking off regular upper body clothing?: None Help from another person to put on and taking off regular lower body clothing?: A Little 6 Click Score: 21   End of Session Equipment Utilized During Treatment: Gait belt;Rolling walker (2 wheels) Nurse Communication: Mobility status  Activity Tolerance: Patient tolerated treatment well Patient left: in chair;with  call bell/phone within reach;with family/visitor present  OT Visit Diagnosis: Other abnormalities of gait and mobility (R26.89);Unsteadiness on feet (R26.81)                Time: 1308-6578 OT Time Calculation (min): 38 min Charges:  OT General Charges $OT Visit: 1 Visit OT Evaluation $OT Eval Moderate Complexity: 1 Mod OT Treatments $Self Care/Home Management : 8-22 mins $Therapeutic Activity: 8-22 mins Emauri Krygier, OTR/L 04/17/24, 1:58 PM  Hadiyah Maricle E Halen Antenucci 04/17/2024, 1:55 PM

## 2024-04-18 DIAGNOSIS — S72002A Fracture of unspecified part of neck of left femur, initial encounter for closed fracture: Secondary | ICD-10-CM | POA: Diagnosis not present

## 2024-04-18 LAB — BASIC METABOLIC PANEL WITH GFR
Anion gap: 7 (ref 5–15)
BUN: 20 mg/dL (ref 8–23)
CO2: 27 mmol/L (ref 22–32)
Calcium: 8.5 mg/dL — ABNORMAL LOW (ref 8.9–10.3)
Chloride: 104 mmol/L (ref 98–111)
Creatinine, Ser: 1.01 mg/dL (ref 0.61–1.24)
GFR, Estimated: >60 mL/min (ref >60)
Glucose, Bld: 91 mg/dL (ref 70–99)
Potassium: 4 mmol/L (ref 3.5–5.1)
Sodium: 138 mmol/L (ref 135–145)

## 2024-04-18 LAB — CBC
HCT: 32.4 % — ABNORMAL LOW (ref 39.0–52.0)
Hemoglobin: 10.4 g/dL — ABNORMAL LOW (ref 13.0–17.0)
MCH: 28.6 pg (ref 26.0–34.0)
MCHC: 32.1 g/dL (ref 30.0–36.0)
MCV: 89 fL (ref 80.0–100.0)
Platelets: 170 10*3/uL (ref 150–400)
RBC: 3.64 MIL/uL — ABNORMAL LOW (ref 4.22–5.81)
RDW: 14.8 % (ref 11.5–15.5)
WBC: 11.6 10*3/uL — ABNORMAL HIGH (ref 4.0–10.5)
nRBC: 0 % (ref 0.0–0.2)

## 2024-04-18 LAB — FERRITIN: Ferritin: 131 ng/mL (ref 24–336)

## 2024-04-18 LAB — IRON AND TIBC
Iron: 27 ug/dL — ABNORMAL LOW (ref 45–182)
Saturation Ratios: 10 % — ABNORMAL LOW (ref 17.9–39.5)
TIBC: 283 ug/dL (ref 250–450)
UIBC: 256 ug/dL

## 2024-04-18 MED ORDER — IRON SUCROSE 300 MG IVPB - SIMPLE MED
300.0000 mg | Status: DC
  Administered 2024-04-18: 300 mg via INTRAVENOUS
  Filled 2024-04-18: qty 265
  Filled 2024-04-18: qty 300

## 2024-04-18 MED ORDER — LACTATED RINGERS IV SOLN: INTRAVENOUS | Status: DC

## 2024-04-18 NOTE — Progress Notes (Signed)
 Physical Therapy Treatment Patient Details Name: Alejandro Williamson MRN: [id number] DOB: 19-Jun-1951 Today's Date: 04/18/2024   History of Present Illness Pt admitted for L hip fx and is s/p L hip hemi on 04/16/24 secondary to recent fall. History includes neuropathy, depression, and GERD.    PT Comments  Pt ready for session.  Stands and is able to complete gait and stair training.  After seated rest, standing ex with walker support.  Pt c/o feeling dizzy.  Stated he did get dizzy with gait yesterday.  Orthostatic BP's taken as a precaution.   Sitting with feet down.  116/67 P 64 Standing 65/45 P 94 Standing 3 min 77/40 P 108 Resolved with sitting in recliner with feet elevated. Rated dizziness 5/10 in standing.  Stated he did not get dizzy when he fell at home and his feet got tangled up standing and turning too fast.  Discussed with MD who will address.  Pt progressing well with mobility.  Discussed with OT who will see pt after lunch to check orthostatics with mobility.   If plan is discharge home, recommend the following: A little help with walking and/or transfers;Help with stairs or ramp for entrance;A little help with bathing/dressing/bathroom   Can travel by private vehicle        Equipment Recommendations  Rolling walker (2 wheels);BSC/3in1    Recommendations for Other Services       Precautions / Restrictions Precautions Precautions: Posterior Hip;Fall Precaution Booklet Issued: Yes (comment) Recall of Precautions/Restrictions: Intact Restrictions Weight Bearing Restrictions Per Provider Order: Yes LLE Weight Bearing Per Provider Order: Weight bearing as tolerated     Mobility  Bed Mobility               General bed mobility comments: NT in recliner pre/post session Patient Response: Cooperative  Transfers Overall transfer level: Needs assistance Equipment used: Rolling walker (2 wheels) Transfers: Sit to/from Stand Sit to Stand: Min assist, Contact guard assist                 Ambulation/Gait Ambulation/Gait assistance: Contact guard assist Gait Distance (Feet): 350 Feet Assistive device: Rolling walker (2 wheels) Gait Pattern/deviations: Step-through pattern           Stairs Stairs: Yes Stairs assistance: Contact guard assist, Supervision Stair Management: Two rails, Step to pattern Number of Stairs: 4     Wheelchair Mobility     Tilt Bed Tilt Bed Patient Response: Cooperative  Modified Rankin (Stroke Patients Only)       Balance Overall balance assessment: Needs assistance Sitting-balance support: Feet supported Sitting balance-Leahy Scale: Normal     Standing balance support: Bilateral upper extremity supported Standing balance-Leahy Scale: Good Standing balance comment: RW and CGA                            Communication Communication Communication: No apparent difficulties  Cognition Arousal: Alert Behavior During Therapy: WFL for tasks assessed/performed                             Following commands: Intact      Cueing Cueing Techniques: Verbal cues  Exercises Other Exercises Other Exercises: standing AROM    General Comments        Pertinent Vitals/Pain Pain Assessment Pain Assessment: Faces Faces Pain Scale: Hurts a little bit Pain Location: L hip Pain Descriptors / Indicators: Operative site guarding Pain Intervention(s): Limited activity within  patient's tolerance, Monitored during session, Repositioned    Home Living                          Prior Function            PT Goals (current goals can now be found in the care plan section) Progress towards PT goals: Progressing toward goals    Frequency    7X/week      PT Plan      Co-evaluation              AM-PAC PT "6 Clicks" Mobility   Outcome Measure  Help needed turning from your back to your side while in a flat bed without using bedrails?: None Help needed moving from lying  on your back to sitting on the side of a flat bed without using bedrails?: None Help needed moving to and from a bed to a chair (including a wheelchair)?: A Little Help needed standing up from a chair using your arms (e.g., wheelchair or bedside chair)?: A Little Help needed to walk in hospital room?: A Little Help needed climbing 3-5 steps with a railing? : A Little 6 Click Score: 20    End of Session Equipment Utilized During Treatment: Gait belt Activity Tolerance: Patient tolerated treatment well;Treatment limited secondary to medical complications (Comment) Patient left: in chair;with call bell/phone within reach Nurse Communication: Mobility status PT Visit Diagnosis: Muscle weakness (generalized) (M62.81);Difficulty in walking, not elsewhere classified (R26.2);Pain Pain - Right/Left: Left Pain - part of body: Hip     Time: 4098-1191 PT Time Calculation (min) (ACUTE ONLY): 25 min  Charges:    $Gait Training: 8-22 mins $Therapeutic Exercise: 8-22 mins PT General Charges $$ ACUTE PT VISIT: 1 Visit                   Charlanne Cong, PTA 04/18/24, 10:59 AM

## 2024-04-18 NOTE — Progress Notes (Addendum)
 PROGRESS NOTE    Alejandro Williamson  ZOX:[id number] DOB: [birth year] DOA: 04/15/2024 PCP: Nestor Banter, MD    Brief Narrative:   73 y.o. male with medical history significant for peripheral neuropathy, depression, dyslipidemia and GERD, who presented to the emergency room with acute onset of mechanical fall with subsequent left hip pain.  The patient apparently tripped and fell while getting up and losing balance.  Denies any headache or dizziness or blurred vision.  No paresthesias or focal muscle weakness.  No chest pain or palpitations.  No tinnitus or vertigo.  No cough or wheezing or dyspnea.  No fever or chills.  No dysuria, oliguria or hematuria or flank pain.  No bleeding diathesis.   Assessment & Plan:   Principal Problem:   Closed left hip fracture, initial encounter [facility]) Active Problems:   Dyslipidemia   Peripheral neuropathy  Closed left hip fracture, initial encounter (H[facility]) Status post mechanical fall.  No evidence of syncopal event.  No head trauma.  X-rays revealed displaced subcapital fracture of left hip.  Patient overall in good health with good functional status.  Status post left hip hemiarthroplasty 5/4 Plan: Continue SQ Lovenox for VTE prophylaxis while admitted.  Per orthopedics recommend aspirin 81 mg twice daily x 6 to 8 weeks on discharge.  Continue therapy efforts continue pain control continue bowel regimen.  Anticipate discharge 5/7.  Orthostatic hypotension Symptomatic.  Noted on therapy evaluations 5/6.  Will monitor in house for tonight.  IV fluids at 100 cc/h.  Repeat orthostatics in AM.  If stable discharge home 5/7 with home health services.  Home health ordered.  C6-C7 stenosis Chronic issue.  Pain was worsened after mechanical fall.  Repeat MRI redemonstrates known severe canal stenosis at C6-C7. 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  Symptomatic orthostatic hypotension.  Anticipate discharge 5/7.   Level of care: Med-Surg  Consultants:  Orthopedics  Procedures:  Left hip hemiarthroplasty 5/4  Antimicrobials: None    Subjective: Seen and examined.  Postoperative day #2.  Feels well overall.  Dizziness upon ambulation improved.  Objective: Vitals:   04/17/24 1717 04/17/24 2058 04/18/24 0459 04/18/24 0831  BP: 111/73 123/75 125/67 137/73  Pulse: 75 71 72 84  Resp: 17 16 18 17   Temp: 98 F (36.7 C) 98.3 F (36.8 C) 98.6 F (37 C) 98.3 F (36.8 C)  TempSrc:      SpO2: 100% 99% 98% 99%  Weight:      Height:        Intake/Output Summary (Last 24 hours) at 04/18/2024 1305 Last data filed at 04/18/2024 0953 Gross per 24 hour  Intake --  Output 1400 ml  Net -1400 ml   Filed Weights   04/15/24 2000  Weight: 94.3 kg    Examination:  General exam: No acute distress Respiratory system: Lungs clear.  Normal breathing.  Room air Cardiovascular system: S1S2, RRR, no murmur Gastrointestinal system: Soft NTND, normal BS  Central nervous system: Alert and oriented. No focal neurological deficits. Extremities: Left lower extremity surgical dressings.  Dressing CDI. Skin: No rashes, lesions or ulcers Psychiatry: Judgement and insight appear normal. Mood & affect appropriate.     Data Reviewed: I have personally reviewed following labs and imaging studies  CBC: Recent Labs  Lab 04/16/24 0238 04/17/24 0326  04/18/24 0456  WBC 10.9* 13.4* 11.6*  NEUTROABS 7.5  --   --   HGB 13.0 11.4* 10.4*  HCT 39.3 35.9* 32.4*  MCV 87.1 89.1 89.0  PLT 187 183 170   Basic Metabolic Panel: Recent Labs  Lab 04/15/24 2114 04/16/24 0238 04/17/24 0326 04/18/24 0456  NA 138 140 141 138  K 4.0 3.6 4.6 4.0  CL 105 109 107 104  CO2 24 24 25 27   GLUCOSE 94 102* 154* 91  BUN 14 13 16 20   CREATININE 0.91 0.89 1.23 1.01  CALCIUM 9.0 8.7* 8.9 8.5*   GFR: Estimated Creatinine Clearance: 74 mL/min (by C-G formula based on SCr of 1.01 mg/dL). Liver Function Tests: Recent Labs  Lab 04/15/24 2114  AST 27  ALT 27  ALKPHOS 42  BILITOT 0.7  PROT 7.0  ALBUMIN 4.2   No results for input(s): "LIPASE", "AMYLASE" in the last 168 hours. No results for input(s): "AMMONIA" in the last 168 hours. Coagulation Profile: Recent Labs  Lab 04/15/24 2342  INR 1.1   Cardiac Enzymes: No results for input(s): "CKTOTAL", "CKMB", "CKMBINDEX", "TROPONINI" in the last 168 hours. BNP (last 3 results) No results for input(s): "PROBNP" in the last 8760 hours. HbA1C: No results for input(s): "HGBA1C" in the last 72 hours. CBG: No results for input(s): "GLUCAP" in the last 168 hours. Lipid Profile: No results for input(s): "CHOL", "HDL", "LDLCALC", "TRIG", "CHOLHDL", "LDLDIRECT" in the last 72 hours. Thyroid Function Tests: No results for input(s): "TSH", "T4TOTAL", "FREET4", "T3FREE", "THYROIDAB" in the last 72 hours. Anemia Panel: Recent Labs    04/18/24 0456  FERRITIN 131  TIBC 283  IRON 27*   Sepsis Labs: No results for input(s): "PROCALCITON", "LATICACIDVEN" in the last 168 hours.  No results found for this or any previous visit (from the past 240 hours).       Radiology Studies: MR CERVICAL SPINE WO CONTRAST Result Date: 04/17/2024 CLINICAL DATA:  Myelopathy, acute, cervical spine EXAM: MRI CERVICAL SPINE WITHOUT CONTRAST TECHNIQUE: Multiplanar, multisequence MR imaging of the cervical spine was  performed. No intravenous contrast was administered. COMPARISON:  None Available. FINDINGS: Alignment: No substantial sagittal subluxation. Vertebrae: No fracture, evidence of discitis, or bone lesion. Cord: Normal cord signal. Posterior Fossa, vertebral arteries, paraspinal tissues: Visualized vertebral artery flow voids are maintained. No paraspinal edema. Disc levels: C2-C3: No significant disc protrusion, foraminal stenosis, or canal stenosis. C3-C4: Posterior disc osteophyte complex with mild facet and uncovertebral hypertrophy. Resulting mild canal stenosis and mild bilateral foraminal stenosis. C4-C5: Posterior disc osteophyte complex with mild facet and uncovertebral hypertrophy. Resulting mild right foraminal stenosis. Patent canal and left foramen. C5-C6: Posterior disc osteophyte complex with mild right facet of vertebral hypertrophy. Resulting mild canal and right foraminal stenosis. Patent left foramen. C6-C7: Posterior disc osteophyte complex with left greater than right facet and uncovertebral hypertrophy. Resulting severe left foraminal stenosis and mild right foraminal stenosis. Mild canal stenosis. C7-T1: No significant disc protrusion, foraminal stenosis, or canal stenosis. IMPRESSION: 1. At C6-C7, severe left foraminal stenosis. Mild canal and right foraminal stenosis at this level. 2. Additional multilevel mild canal and foraminal stenosis is detailed above.  Electronically Signed   By: Stevenson Elbe M.D.   On: 04/17/2024 02:00        Scheduled Meds:  ascorbic acid  1,000 mg Oral Daily   cholecalciferol  1,000 Units Oral Daily   docusate sodium  100 mg Oral BID   enoxaparin (LOVENOX) injection  30 mg Subcutaneous Q24H   ferrous sulfate  325 mg Oral Q breakfast   gabapentin  300 mg Oral TID   loratadine  10 mg Oral Daily   multivitamin with minerals  1 tablet Oral Daily   Continuous Infusions:  lactated ringers 100 mL/hr at 04/18/24 1112     LOS: 3 days      Tiajuana Fluke, MD [facility]   If 7PM-7AM, please contact night-coverage  04/18/2024, 1:05 PM

## 2024-04-18 NOTE — Progress Notes (Signed)
 Subjective: 2 Days Post-Op Procedure(s) (LRB): HEMIARTHROPLASTY (BIPOLAR) HIP, POSTERIOR APPROACH FOR FRACTURE (Left) Patient doing well.  Out of bed with PT.  He got a little lightheaded and is getting some IV fluid.  Hemoglobin is stable. The hip is stable. The dressing is dry.  Will change to Aquacel today. He may probably go home with home health PT tomorrow. Follow-up in my office in 2 weeks.  Patient reports pain as mild.  Objective:   VITALS:   Vitals:   04/18/24 0459 04/18/24 0831  BP: 125/67 137/73  Pulse: 72 84  Resp: 18 17  Temp: 98.6 F (37 C) 98.3 F (36.8 C)  SpO2: 98% 99%    Neurologically intact Dorsiflexion/Plantar flexion intact Incision: dressing C/D/I  LABS Recent Labs    04/16/24 0238 04/17/24 0326 04/18/24 0456  HGB 13.0 11.4* 10.4*  HCT 39.3 35.9* 32.4*  WBC 10.9* 13.4* 11.6*  PLT 187 183 170    Recent Labs    04/16/24 0238 04/17/24 0326 04/18/24 0456  NA 140 141 138  K 3.6 4.6 4.0  BUN 13 16 20   CREATININE 0.89 1.23 1.01  GLUCOSE 102* 154* 91    Recent Labs    04/15/24 2342  INR 1.1     Assessment/Plan: 2 Days Post-Op Procedure(s) (LRB): HEMIARTHROPLASTY (BIPOLAR) HIP, POSTERIOR APPROACH FOR FRACTURE (Left)   Up with therapy Discharge home with home health 81 mg ASA twice daily for 6 to 8 weeks Weightbearing as tolerated with walker left leg Return to my office in 2 weeks for exam and x-ray

## 2024-04-18 NOTE — Progress Notes (Addendum)
 Occupational Therapy Treatment Patient Details Name: Alejandro Williamson MRN: [id number] DOB: Oct 09, 1951 Today's Date: 04/18/2024   History of present illness Pt admitted for L hip fx and is s/p L hip hemi on 04/16/24 secondary to recent fall. History includes neuropathy, depression, and GERD.   OT comments  Alejandro Williamson was seen for OT treatment on this date. Upon arrival to room pt seated in chair, daughter at bedside agreeable to tx. Pt requires SUPERVISION + RW for funcitonal mobility ~350 ft. Orthostatics taken, no symptoms reported. Pt and family educated on car t/f technique, falls safety, adapted dressing, functional application of posterior hip pcns, and DME recs. Pt making good progress toward goals, will continue to follow POC. Discharge recommendation remains appropriate.    Orthostatic VS for the past 24 hrs:  BP- Lying BP- Sitting Pulse- Sitting BP- Standing at 0 minutes Pulse- Standing at 0 minutes  04/18/24 1636 -- 118/71 81 100/68 92        If plan is discharge home, recommend the following:  A little help with walking and/or transfers;A little help with bathing/dressing/bathroom;Assistance with cooking/housework;Help with stairs or ramp for entrance;Assist for transportation   Equipment Recommendations  BSC/3in1    Recommendations for Other Services      Precautions / Restrictions Precautions Precautions: Posterior Hip;Fall Precaution Booklet Issued: Yes (comment) Recall of Precautions/Restrictions: Intact Restrictions Weight Bearing Restrictions Per Provider Order: Yes LLE Weight Bearing Per Provider Order: Weight bearing as tolerated       Mobility Bed Mobility               General bed mobility comments: not tested    Transfers Overall transfer level: Needs assistance Equipment used: Rolling walker (2 wheels) Transfers: Sit to/from Stand Sit to Stand: Supervision                 Balance Overall balance assessment: Needs assistance Sitting-balance  support: Feet supported Sitting balance-Leahy Scale: Normal     Standing balance support: No upper extremity supported, During functional activity Standing balance-Leahy Scale: Good                             ADL either performed or assessed with clinical judgement   ADL Overall ADL's : Needs assistance/impaired                                       General ADL Comments: SUPERVISION + RW for funcitonal mobility ~350 ft.     Communication Communication Communication: No apparent difficulties   Cognition Arousal: Alert Behavior During Therapy: WFL for tasks assessed/performed Cognition: No apparent impairments                               Following commands: Intact                      Pertinent Vitals/ Pain       Pain Assessment Pain Assessment: 0-10 Pain Score: 5  Pain Location: L hip Pain Descriptors / Indicators: Operative site guarding Pain Intervention(s): Limited activity within patient's tolerance, Repositioned   Frequency  Min 2X/week        Progress Toward Goals  OT Goals(current goals can now be found in the care plan section)  Progress towards OT goals: Progressing toward goals  Acute Rehab  OT Goals OT Goal Formulation: With patient Time For Goal Achievement: 05/01/24 Potential to Achieve Goals: Good ADL Goals Pt Will Perform Lower Body Bathing: with adaptive equipment;sitting/lateral leans;sit to/from stand;with supervision Pt Will Perform Lower Body Dressing: with supervision;with adaptive equipment;sitting/lateral leans;sit to/from stand Pt Will Transfer to Toilet: with contact guard assist;bedside commode;regular height toilet;ambulating;grab bars Pt Will Perform Toileting - Clothing Manipulation and hygiene: with supervision;sit to/from stand;sitting/lateral leans  Plan      Co-evaluation                 AM-PAC OT "6 Clicks" Daily Activity     Outcome Measure   Help from another  person eating meals?: None Help from another person taking care of personal grooming?: None Help from another person toileting, which includes using toliet, bedpan, or urinal?: A Little Help from another person bathing (including washing, rinsing, drying)?: A Little Help from another person to put on and taking off regular upper body clothing?: None Help from another person to put on and taking off regular lower body clothing?: A Little 6 Click Score: 21    End of Session Equipment Utilized During Treatment: Rolling walker (2 wheels)  OT Visit Diagnosis: Other abnormalities of gait and mobility (R26.89);Unsteadiness on feet (R26.81)   Activity Tolerance Patient tolerated treatment well   Patient Left in chair;with call bell/phone within reach;with family/visitor present   Nurse Communication          Time: 1610-9604 OT Time Calculation (min): 32 min  Charges: OT General Charges $OT Visit: 1 Visit OT Treatments $Self Care/Home Management : 8-22 mins $Therapeutic Activity: 8-22 mins  Alejandro Williamson, M.S. OTR/L  04/18/24, 4:35 PM  ascom [phone]

## 2024-04-18 NOTE — Plan of Care (Signed)
   Problem: Education: Goal: Knowledge of General Education information will improve Description: Including pain rating scale, medication(s)/side effects and non-pharmacologic comfort measures Outcome: Progressing   Problem: Activity: Goal: Risk for activity intolerance will decrease Outcome: Progressing   Problem: Nutrition: Goal: Adequate nutrition will be maintained Outcome: Progressing

## 2024-04-19 ENCOUNTER — Other Ambulatory Visit: Payer: Self-pay

## 2024-04-19 ENCOUNTER — Other Ambulatory Visit (HOSPITAL_COMMUNITY): Payer: Self-pay | Admitting: Internal Medicine

## 2024-04-19 ENCOUNTER — Telehealth (HOSPITAL_COMMUNITY): Payer: Self-pay | Admitting: Internal Medicine

## 2024-04-19 DIAGNOSIS — D508 Other iron deficiency anemias: Secondary | ICD-10-CM

## 2024-04-19 DIAGNOSIS — S72002A Fracture of unspecified part of neck of left femur, initial encounter for closed fracture: Secondary | ICD-10-CM | POA: Diagnosis not present

## 2024-04-19 DIAGNOSIS — W19XXXA Unspecified fall, initial encounter: Secondary | ICD-10-CM | POA: Diagnosis not present

## 2024-04-19 DIAGNOSIS — D649 Anemia, unspecified: Secondary | ICD-10-CM

## 2024-04-19 LAB — BASIC METABOLIC PANEL WITH GFR
Anion gap: 8 (ref 5–15)
BUN: 14 mg/dL (ref 8–23)
CO2: 25 mmol/L (ref 22–32)
Calcium: 8.5 mg/dL — ABNORMAL LOW (ref 8.9–10.3)
Chloride: 106 mmol/L (ref 98–111)
Creatinine, Ser: 0.96 mg/dL (ref 0.61–1.24)
GFR, Estimated: >60 mL/min (ref >60)
Glucose, Bld: 96 mg/dL (ref 70–99)
Potassium: 3.9 mmol/L (ref 3.5–5.1)
Sodium: 139 mmol/L (ref 135–145)

## 2024-04-19 LAB — CBC WITH DIFFERENTIAL/PLATELET
Abs Immature Granulocytes: 0.03 10*3/uL (ref 0.00–0.07)
Basophils Absolute: 0.1 10*3/uL (ref 0.0–0.1)
Basophils Relative: 1 %
Eosinophils Absolute: 0.3 10*3/uL (ref 0.0–0.5)
Eosinophils Relative: 4 %
HCT: 30.4 % — ABNORMAL LOW (ref 39.0–52.0)
Hemoglobin: 9.7 g/dL — ABNORMAL LOW (ref 13.0–17.0)
Immature Granulocytes: 0 %
Lymphocytes Relative: 23 %
Lymphs Abs: 2.1 10*3/uL (ref 0.7–4.0)
MCH: 28.6 pg (ref 26.0–34.0)
MCHC: 31.9 g/dL (ref 30.0–36.0)
MCV: 89.7 fL (ref 80.0–100.0)
Monocytes Absolute: 1.3 10*3/uL — ABNORMAL HIGH (ref 0.1–1.0)
Monocytes Relative: 13 %
Neutro Abs: 5.6 10*3/uL (ref 1.7–7.7)
Neutrophils Relative %: 59 %
Platelets: 172 10*3/uL (ref 150–400)
RBC: 3.39 MIL/uL — ABNORMAL LOW (ref 4.22–5.81)
RDW: 14.7 % (ref 11.5–15.5)
WBC: 9.4 10*3/uL (ref 4.0–10.5)
nRBC: 0 % (ref 0.0–0.2)

## 2024-04-19 MED ORDER — OXYCODONE HCL 5 MG PO TABS
5.0000 mg | ORAL_TABLET | Freq: Two times a day (BID) | ORAL | 0 refills | Status: AC | PRN
  Filled 2024-04-19: qty 6, 3d supply, fill #0

## 2024-04-19 MED ORDER — FERROUS GLUCONATE 324 (38 FE) MG PO TABS
324.0000 mg | ORAL_TABLET | Freq: Every day | ORAL | 0 refills | Status: AC
  Filled 2024-04-19: qty 18, 18d supply, fill #0

## 2024-04-19 MED ORDER — ASPIRIN 81 MG PO TBEC
81.0000 mg | DELAYED_RELEASE_TABLET | Freq: Every day | ORAL | 0 refills | Status: AC
  Filled 2024-04-19: qty 60, 60d supply, fill #0

## 2024-04-19 MED ORDER — IRON SUCROSE 300 MG IVPB - SIMPLE MED
300.0000 mg | Status: AC
  Administered 2024-04-19: 300 mg via INTRAVENOUS
  Filled 2024-04-19: qty 300

## 2024-04-19 NOTE — Discharge Summary (Signed)
 Physician Discharge Summary   Patient: Alejandro Williamson MRN: [id number] DOB: [birth year]  Admit date:     04/15/2024  Discharge date: 04/19/24  Discharge Physician: Brenna Cam   PCP: Nestor Banter, MD   Recommendations at discharge:    F/up with outpt providers as requested  Discharge Diagnoses: Principal Problem:   Closed left hip fracture, initial encounter [facility]) Active Problems:   Dyslipidemia   Peripheral neuropathy   Fall   Absolute anemia  Hospital Course: Assessment and Plan:  73 y.o. male with medical history significant for peripheral neuropathy, depression, dyslipidemia and GERD, who presented to the emergency room with acute onset of mechanical fall with subsequent left hip pain.  The patient apparently tripped and fell while getting up and losing balance.  Denies any headache or dizziness or blurred vision.  No paresthesias or focal muscle weakness.  No chest pain or palpitations.  No tinnitus or vertigo.  No cough or wheezing or dyspnea.  No fever or chills.  No dysuria, oliguria or hematuria or flank pain.  No bleeding diathesis.    Closed left hip fracture, initial encounter (HCC) Status post mechanical fall.  No evidence of syncopal event.  No head trauma.  X-rays revealed displaced subcapital fracture of left hip.  Status post left hip hemiarthroplasty 5/4 Ambulating well with therapy. Per orthopedics recommend aspirin 81 mg twice daily x 6 to 8 weeks on discharge.   Going home. He declined HHPT, OT   Orthostatic hypotension Symptomatic.  Noted on therapy evaluations 5/6.  Could be due to Gabapentin. Recommend holding off increasing the dose and keep at home dose for now. No further symptoms   C6-C7 stenosis Chronic issue.  Pain was worsened after mechanical fall.  Repeat MRI redemonstrates known severe canal stenosis at C6-C7. Continue gabapentin at home dose   Peripheral neuropathy Continue Neurontin at home dose   Dyslipidemia statin   Post operative  blood loss anemia Acute on chronic iron deficiency anemia Iron stores low Patient on chronic  iron replacement Drop in Hb likely underlying post operative Hemodilution S/p 2 IV iron infusion D/Ced on PO Iron       Consultants: Ortho Procedures performed: left hip hemiarthroplasty 5/4  Disposition: Home health Diet recommendation:  Discharge Diet Orders (From admission, onward)     Start     Ordered   04/19/24 0000  Diet - low sodium heart healthy        04/19/24 1357           Carb modified diet DISCHARGE MEDICATION: Allergies as of 04/19/2024   No Known Allergies      Medication List     STOP taking these medications    meloxicam 15 MG tablet Commonly known as: MOBIC       TAKE these medications    acetaminophen 325 MG tablet Commonly known as: TYLENOL Take 975 mg by mouth every 6 (six) hours as needed.   ascorbic acid 1000 MG tablet Commonly known as: VITAMIN C Take by mouth.   aspirin EC 81 MG tablet Take 1 tablet (81 mg total) by mouth daily. Swallow whole.   atorvastatin 40 MG tablet Commonly known as: LIPITOR Take 20 mg by mouth at bedtime.   Cetirizine HCl 10 MG Caps Take by mouth.   Cholecalciferol 25 MCG (1000 UT) capsule Take by mouth.   cyanocobalamin 1000 MCG tablet Commonly known as: VITAMIN B12 Take 1,000 mcg by mouth daily.   DULoxetine 30 MG capsule Commonly known as: CYMBALTA Take  60 mg by mouth daily.   ferrous gluconate 324 MG tablet Commonly known as: FERGON Take 1 tablet (324 mg total) by mouth daily with breakfast.   fluticasone 50 MCG/ACT nasal spray Commonly known as: FLONASE Place 1 spray into both nostrils daily.   gabapentin 100 MG capsule Commonly known as: NEURONTIN Take 100 mg by mouth 3 (three) times daily. Total of 500mg  a day   Multi-Vitamin tablet Take by mouth.   oxyCODONE 5 MG immediate release tablet Commonly known as: Oxy IR/ROXICODONE Take 1 tablet (5 mg total) by mouth every 12 (twelve)  hours as needed for up to 3 days for severe pain (pain score 7-10), moderate pain (pain score 4-6) or breakthrough pain (for pain score of 1-4).   tizanidine 2 MG capsule Commonly known as: ZANAFLEX TAKE 1 CAPSULE BY MOUTH 3 TIMES DAILY AS NEEDED FOR MUSCLE SPASMS.               Durable Medical Equipment  (From admission, onward)           Start     Ordered   04/19/24 1254  For home use only DME Walker rolling  Once       Question Answer Comment  Walker: With 5 Inch Wheels   Patient needs a walker to treat with the following condition Generalized weakness      04/19/24 1254   04/19/24 1254  For home use only DME 3 n 1  Once        04/19/24 1254              Discharge Care Instructions  (From admission, onward)           Start     Ordered   04/19/24 0000  Discharge wound care:       Comments: As above   04/19/24 1357            Follow-up Information     Marlynn Singer, MD. Go on 05/01/2024.   Specialty: Orthopedic Surgery Why: Monday, May 19 at 1:15 p.m. For re-evaluation, For wound re-check, For suture removal  Please make appointment for the patient to be seen in my office 2 weeks after discharge prior to his leaving the hospital Contact information: [address] [place] [address] [phone]         Nestor Banter, MD. Go on 04/27/2024.   Specialty: Family Medicine Why: Thursday, May 15 at 4:00 p.m. Post Hospital Discharge F/UP Contact information: [address] [facility] [place] [address] [phone]         Annamae Barrett, [employer] Follow up.   Why: 05/07---Per Sam Creighton, will process for bedside delivery Contact information: [address] [place] [address] [phone]                Discharge Exam: Alejandro Williamson   04/15/24 2000  Weight: 94.3 kg   General exam: No acute distress Respiratory system: Lungs clear.  Normal breathing.   Room air Cardiovascular system: S1S2, RRR, no murmur Gastrointestinal system: Soft NTND, normal BS Central nervous system: Alert and oriented. No focal neurological deficits. Extremities: Left lower extremity surgical dressings.  Dressing CDI. Skin: No rashes, lesions or ulcers Psychiatry: Judgement and insight appear normal. Mood & affect appropriate.   Condition at discharge: good  The results of significant diagnostics from this hospitalization (including imaging, microbiology, ancillary and laboratory) are listed below for reference.   Imaging Studies:  MR CERVICAL SPINE WO CONTRAST Result Date: 04/17/2024 CLINICAL DATA:  Myelopathy, acute, cervical spine EXAM: MRI CERVICAL SPINE WITHOUT CONTRAST TECHNIQUE: Multiplanar, multisequence MR imaging of the cervical spine was performed. No intravenous contrast was administered. COMPARISON:  None Available. FINDINGS: Alignment: No substantial sagittal subluxation. Vertebrae: No fracture, evidence of discitis, or bone lesion. Cord: Normal cord signal. Posterior Fossa, vertebral arteries, paraspinal tissues: Visualized vertebral artery flow voids are maintained. No paraspinal edema. Disc levels: C2-C3: No significant disc protrusion, foraminal stenosis, or canal stenosis. C3-C4: Posterior disc osteophyte complex with mild facet and uncovertebral hypertrophy. Resulting mild canal stenosis and mild bilateral foraminal stenosis. C4-C5: Posterior disc osteophyte complex with mild facet and uncovertebral hypertrophy. Resulting mild right foraminal stenosis. Patent canal and left foramen. C5-C6: Posterior disc osteophyte complex with mild right facet of vertebral hypertrophy. Resulting mild canal and right foraminal stenosis. Patent left foramen. C6-C7: Posterior disc osteophyte complex with left greater than right facet and uncovertebral hypertrophy. Resulting severe left foraminal stenosis and mild right foraminal stenosis. Mild canal stenosis. C7-T1: No  significant disc protrusion, foraminal stenosis, or canal stenosis. IMPRESSION: 1. At C6-C7, severe left foraminal stenosis. Mild canal and right foraminal stenosis at this level. 2. Additional multilevel mild canal and foraminal stenosis is detailed above. Electronically Signed   By: Stevenson Elbe M.D.   On: 04/17/2024 02:00   DG HIP UNILAT W OR W/O PELVIS 2-3 VIEWS LEFT Result Date: 04/16/2024 CLINICAL DATA:  Postop left hip hemiarthroplasty for femoral neck fracture. EXAM: DG HIP (WITH OR WITHOUT PELVIS) 2-3V LEFT COMPARISON:  Radiographs 04/15/2024. FINDINGS: Interval left hip bipolar hemiarthroplasty. The hardware is well positioned. There is no evidence of acute fracture or dislocation. There is gas within the left hip joint and surrounding soft tissues. Skin staples are in place. Sacral spinal stimulator leads are partially imaged. IMPRESSION: Interval left hip bipolar hemiarthroplasty without evidence of immediate postoperative complication. Electronically Signed   By: Elmon Hagedorn M.D.   On: 04/16/2024 13:36   DG Hip Unilat W or Wo Pelvis 2-3 Views Left Result Date: 04/15/2024 CLINICAL DATA:  Recent fall with left hip pain, initial encounter EXAM: DG HIP (WITH OR WITHOUT PELVIS) 3V LEFT COMPARISON:  None Available. FINDINGS: Pelvic ring is intact. Subcapital left femoral neck fracture is noted with impaction and angulation at the fracture site. Femoral head remains well seated. No soft tissue abnormality is noted. IMPRESSION: Subcapital left femoral neck fracture. Electronically Signed   By: Violeta Grey M.D.   On: 04/15/2024 21:05   DG Chest 1 View Result Date: 04/15/2024 CLINICAL DATA:  Known left hip fracture EXAM: PORTABLE CHEST 1 VIEW COMPARISON:  None Available. FINDINGS: Cardiac shadow is within normal limits. Lungs are well aerated bilaterally. No focal infiltrate or effusion is seen. No bony abnormality is noted. IMPRESSION: No acute abnormality noted. Electronically Signed   By: Violeta Grey M.D.   On: 04/15/2024 21:05   DG Shoulder Left Result Date: 04/15/2024 CLINICAL DATA:  Recent fall with left shoulder pain, initial encounter EXAM: LEFT SHOULDER - 2+ VIEW COMPARISON:  None Available. FINDINGS: Degenerative changes of the acromioclavicular joint are seen. No acute fracture or dislocation is noted. No soft tissue abnormality is seen. IMPRESSION: No acute abnormality noted. Electronically Signed   By: Violeta Grey M.D.   On: 04/15/2024 21:04    Microbiology: No results found for this or any previous visit.  Labs: CBC: Recent Labs  Lab 04/16/24 0238 04/17/24 0326 04/18/24 0456 04/19/24 0309  WBC 10.9*  13.4* 11.6* 9.4  NEUTROABS 7.5  --   --  5.6  HGB 13.0 11.4* 10.4* 9.7*  HCT 39.3 35.9* 32.4* 30.4*  MCV 87.1 89.1 89.0 89.7  PLT 187 183 170 172   Basic Metabolic Panel: Recent Labs  Lab 04/15/24 2114 04/16/24 0238 04/17/24 0326 04/18/24 0456 04/19/24 0309  NA 138 140 141 138 139  K 4.0 3.6 4.6 4.0 3.9  CL 105 109 107 104 106  CO2 24 24 25 27 25   GLUCOSE 94 102* 154* 91 96  BUN 14 13 16 20 14   CREATININE 0.91 0.89 1.23 1.01 0.96  CALCIUM 9.0 8.7* 8.9 8.5* 8.5*   Liver Function Tests: Recent Labs  Lab 04/15/24 2114  AST 27  ALT 27  ALKPHOS 42  BILITOT 0.7  PROT 7.0  ALBUMIN 4.2   CBG: No results for input(s): "GLUCAP" in the last 168 hours.  Discharge time spent: greater than 30 minutes.  Signed: Brenna Cam, MD [facility] 04/19/2024

## 2024-04-19 NOTE — Telephone Encounter (Signed)
 Patient referred to infusion pharmacy team for ambulatory infusion of IV iron.  Insurance - Physicist, medical  Dx code - D50.8 IV Iron Therapy - Feraheme 510 mg IV x 2  Infusion appointments - Scheduling team will schedule patient as soon as possible.   Will place orders in Signed and held and will reach out to [facility] to get the patient scheduled.

## 2024-04-19 NOTE — Progress Notes (Signed)
 Discharge education and teaching provided, all belongings sent with patient. All questions answered. IV removed, medications brought to patient; awaiting walker and BSC delivery to bedside.

## 2024-04-19 NOTE — TOC Transition Note (Addendum)
 Transition of Care [facility]) - Discharge Note   Patient Details  Name: Alejandro Williamson MRN: [id number] Date of Birth: 05-11-1951  [facility]) CM/SW Contact:  Crayton Docker, RN 04/19/2024, 3:05 PM   Clinical Narrative:     Discharge orders to home. [facility] is following for discharge for home health PT/OT. CM awaiting DME of rolling walker and 3 in 1 bedside commode to bedside. Per patient, patient's son, [name], will provide transportation at discharge.   CM call to patient's phone regarding [facility] following for home health services and CM awaiting DME arrival to the bedside. No answer, CM left message regarding home health and DME.  Final next level of care: Home w Home Health Services Barriers to Discharge: No Barriers Identified   Patient Goals and CMS Choice    Home with home health DME: rolling walker and 3 in 1 bedside commode     Discharge Placement       Home with home health          Discharge Plan and Services Additional resources added to the After Visit Summary for     Discharge Planning Services: CM Consult Post Acute Care Choice: Durable Medical Equipment, Home Health          DME Arranged: 3-N-1, Walker rolling DME Agency: [employer] Date DME Agency Contacted: 04/19/24 Time DME Agency Contacted: [phone] Representative spoke with at DME Agency: Sam Creighton HH Arranged: PT, OT HH Agency: [facility] Date [facility] Agency Contacted: 04/19/24 Time HH Agency Contacted: [phone] Representative spoke with at [facility] Agency: Bartholomew Light  Social Drivers of Health (SDOH) Interventions SDOH Screenings   Food Insecurity: No Food Insecurity (04/16/2024)  Housing: Low Risk  (04/16/2024)  Transportation Needs: No Transportation Needs (04/16/2024)  Utilities: Not At Risk (04/16/2024)  Financial Resource Strain: Low Risk  (02/21/2024)   Received from [facility]  Social Connections: Socially Integrated (04/16/2024)  Tobacco Use: Low  Risk  (02/21/2024)   Received from [facility]     Readmission Risk Interventions     No data to display

## 2024-04-19 NOTE — Progress Notes (Addendum)
 Physical Therapy Treatment Patient Details Name: Alejandro Williamson MRN: [id number] DOB: [birth year] Today's Date: 04/19/2024   History of Present Illness Pt admitted for L hip fx and is s/p L hip hemi on 04/16/24 secondary to recent fall. History includes neuropathy, depression, and GERD.    PT Comments  Pt was sitting in recliner upon arrival. He endorses feeling sore/ buring over incision however pain did not limit session progression. No symptoms of dizziness or orthostatic hypotension throughout session. Easily and safely able to stand and ambulate without physical assistance. Pt is progressing well. Recommend HHPT at DC to maximize his independence and safety with all ADLs.   If plan is discharge home, recommend the following: A little help with walking and/or transfers;Help with stairs or ramp for entrance;A little help with bathing/dressing/bathroom     Equipment Recommendations  Rolling walker (2 wheels);BSC/3in1       Precautions / Restrictions Precautions Precautions: Posterior Hip;Fall Precaution Booklet Issued: Yes (comment) Recall of Precautions/Restrictions: Intact Restrictions Weight Bearing Restrictions Per Provider Order: Yes LLE Weight Bearing Per Provider Order: Weight bearing as tolerated     Mobility  Bed Mobility  General bed mobility comments: in recliner pre/post session    Transfers Overall transfer level: Needs assistance Equipment used: Rolling walker (2 wheels) Transfers: Sit to/from Stand Sit to Stand: Supervision   Ambulation/Gait Ambulation/Gait assistance: Supervision Gait Distance (Feet): 200 Feet Assistive device: Rolling walker (2 wheels) Gait Pattern/deviations: Step-through pattern  General Gait Details: ambulated around RN station with reciprocal gait pattern. Good posture and able to look forward.   Balance Overall balance assessment: Needs assistance Sitting-balance support: Feet supported Sitting balance-Leahy Scale: Normal     Standing  balance support: No upper extremity supported, During functional activity Standing balance-Leahy Scale: Good       Communication Communication Communication: No apparent difficulties  Cognition Arousal: Alert Behavior During Therapy: WFL for tasks assessed/performed   PT - Cognitive impairments: No apparent impairments      PT - Cognition Comments: pleasant and agreeable to session Following commands: Intact            General Comments General comments (skin integrity, edema, etc.): reviewed routine mobility, HEP, car transfers, and expectations at DC. pt states he feels confident in safe DC home once cleared medically.      Pertinent Vitals/Pain Pain Assessment Pain Assessment: 0-10 Pain Score: 4  Pain Location: L hip Pain Descriptors / Indicators: Operative site guarding Pain Intervention(s): Limited activity within patient's tolerance, Monitored during session, Premedicated before session, Repositioned     PT Goals (current goals can now be found in the care plan section) Acute Rehab PT Goals Patient Stated Goal: to go home Progress towards PT goals: Progressing toward goals    Frequency    7X/week       AM-PAC PT "6 Clicks" Mobility   Outcome Measure  Help needed turning from your back to your side while in a flat bed without using bedrails?: None Help needed moving from lying on your back to sitting on the side of a flat bed without using bedrails?: A Little Help needed moving to and from a bed to a chair (including a wheelchair)?: A Little Help needed standing up from a chair using your arms (e.g., wheelchair or bedside chair)?: A Little Help needed to walk in hospital room?: A Little Help needed climbing 3-5 steps with a railing? : A Little 6 Click Score: 19    End of Session   Activity Tolerance: Patient tolerated  treatment well Patient left: in chair;with call bell/phone within reach Nurse Communication: Mobility status PT Visit Diagnosis: Muscle  weakness (generalized) (M62.81);Difficulty in walking, not elsewhere classified (R26.2);Pain Pain - Right/Left: Left Pain - part of body: Hip     Time: 0850-0905 PT Time Calculation (min) (ACUTE ONLY): 15 min  Charges:    $Gait Training: 8-22 mins PT General Charges $$ ACUTE PT VISIT: 1 Visit                     Chester Costa PTA 04/19/24, 10:33 AM

## 2024-05-02 ENCOUNTER — Ambulatory Visit: Payer: Medicare HMO | Admitting: Dermatology

## 2024-07-11 ENCOUNTER — Ambulatory Visit: Admitting: Dermatology

## 2024-07-25 ENCOUNTER — Encounter: Payer: Self-pay | Admitting: Dermatology

## 2024-07-25 ENCOUNTER — Ambulatory Visit: Admitting: Dermatology

## 2024-07-25 DIAGNOSIS — L814 Other melanin hyperpigmentation: Secondary | ICD-10-CM

## 2024-07-25 DIAGNOSIS — Z1283 Encounter for screening for malignant neoplasm of skin: Secondary | ICD-10-CM

## 2024-07-25 DIAGNOSIS — D1801 Hemangioma of skin and subcutaneous tissue: Secondary | ICD-10-CM

## 2024-07-25 DIAGNOSIS — L820 Inflamed seborrheic keratosis: Secondary | ICD-10-CM | POA: Diagnosis not present

## 2024-07-25 DIAGNOSIS — L578 Other skin changes due to chronic exposure to nonionizing radiation: Secondary | ICD-10-CM

## 2024-07-25 DIAGNOSIS — W908XXA Exposure to other nonionizing radiation, initial encounter: Secondary | ICD-10-CM

## 2024-07-25 DIAGNOSIS — L570 Actinic keratosis: Secondary | ICD-10-CM

## 2024-07-25 DIAGNOSIS — Z85828 Personal history of other malignant neoplasm of skin: Secondary | ICD-10-CM

## 2024-07-25 NOTE — Patient Instructions (Addendum)

## 2024-07-25 NOTE — Progress Notes (Signed)
 Follow-Up Visit   Subjective  Alejandro Williamson is a 73 y.o. male who presents for the following: Skin Cancer Screening and Full Body Skin Exam hx of BCC, AKs  The patient presents for Total-Body Skin Exam (TBSE) for skin cancer screening and mole check. The patient has spots, moles and lesions to be evaluated, some may be new or changing and the patient may have concern these could be cancer.  The following portions of the chart were reviewed this encounter and updated as appropriate: medications, allergies, medical history  Review of Systems:  No other skin or systemic complaints except as noted in HPI or Assessment and Plan.  Objective  Well appearing patient in no apparent distress; mood and affect are within normal limits.  A full examination was performed including scalp, head, eyes, ears, nose, lips, neck, chest, axillae, abdomen, back, buttocks, bilateral upper extremities, bilateral lower extremities, hands, feet, fingers, toes, fingernails, and toenails. All findings within normal limits unless otherwise noted below.   Relevant physical exam findings are noted in the Assessment and Plan.  L temple x 1 Pink scaly macules L infraorbital x 1 Stuck on waxy paps with erythema  Assessment & Plan   SKIN CANCER SCREENING PERFORMED TODAY.  ACTINIC DAMAGE - Chronic condition, secondary to cumulative UV/sun exposure - diffuse scaly erythematous macules with underlying dyspigmentation - Recommend daily broad spectrum sunscreen SPF 30+ to sun-exposed areas, reapply every 2 hours as needed.  - Staying in the shade or wearing long sleeves, sun glasses (UVA+UVB protection) and wide brim hats (4-inch brim around the entire circumference of the hat) are also recommended for sun protection.  - Call for new or changing lesions.  LENTIGINES, SEBORRHEIC KERATOSES, HEMANGIOMAS - Benign normal skin lesions - Benign-appearing - Call for any changes  MELANOCYTIC NEVI - Tan-brown and/or  pink-flesh-colored symmetric macules and papules - Benign appearing on exam today - Observation - Call clinic for new or changing moles - Recommend daily use of broad spectrum spf 30+ sunscreen to sun-exposed areas.   HISTORY OF BASAL CELL CARCINOMA OF THE SKIN - No evidence of recurrence today - Recommend regular full body skin exams - Recommend daily broad spectrum sunscreen SPF 30+ to sun-exposed areas, reapply every 2 hours as needed.  - Call if any new or changing lesions are noted between office visits  - R med forehead 3.0cm sup to right medial brow  AK (ACTINIC KERATOSIS) L temple x 1 Actinic keratoses are precancerous spots that appear secondary to cumulative UV radiation exposure/sun exposure over time. They are chronic with expected duration over 1 year. A portion of actinic keratoses will progress to squamous cell carcinoma of the skin. It is not possible to reliably predict which spots will progress to skin cancer and so treatment is recommended to prevent development of skin cancer.  Recommend daily broad spectrum sunscreen SPF 30+ to sun-exposed areas, reapply every 2 hours as needed.  Recommend staying in the shade or wearing long sleeves, sun glasses (UVA+UVB protection) and wide brim hats (4-inch brim around the entire circumference of the hat). Call for new or changing lesions. Destruction of lesion - L temple x 1 Complexity: simple   Destruction method: cryotherapy   Informed consent: discussed and consent obtained   Timeout:  patient name, date of birth, surgical site, and procedure verified Lesion destroyed using liquid nitrogen: Yes   Region frozen until ice ball extended beyond lesion: Yes   Outcome: patient tolerated procedure well with no complications  Post-procedure details: wound care instructions given    INFLAMED SEBORRHEIC KERATOSIS L infraorbital x 1 Symptomatic, irritating, patient would like treated. Destruction of lesion - L infraorbital x  1 Complexity: simple   Destruction method: cryotherapy   Informed consent: discussed and consent obtained   Timeout:  patient name, date of birth, surgical site, and procedure verified Lesion destroyed using liquid nitrogen: Yes   Region frozen until ice ball extended beyond lesion: Yes   Outcome: patient tolerated procedure well with no complications   Post-procedure details: wound care instructions given    Return in about 1 year (around 07/25/2025) for TBSE, Hx of BCC, Hx of AKs.  I, Grayce Saunas, RMA, am acting as scribe for Alm Rhyme, MD .   Documentation: I have reviewed the above documentation for accuracy and completeness, and I agree with the above.  Alm Rhyme, MD

## 2024-09-26 ENCOUNTER — Other Ambulatory Visit: Payer: Self-pay | Admitting: Physical Medicine & Rehabilitation

## 2024-09-26 DIAGNOSIS — M5416 Radiculopathy, lumbar region: Secondary | ICD-10-CM

## 2024-10-02 ENCOUNTER — Other Ambulatory Visit: Payer: Self-pay | Admitting: Physical Medicine & Rehabilitation

## 2024-10-02 DIAGNOSIS — M5416 Radiculopathy, lumbar region: Secondary | ICD-10-CM

## 2024-10-03 ENCOUNTER — Ambulatory Visit

## 2024-10-03 ENCOUNTER — Ambulatory Visit
Admission: RE | Admit: 2024-10-03 | Discharge: 2024-10-03 | Disposition: A | Discharge status: Home / Self Care | Source: Ambulatory Visit | Attending: Physical Medicine & Rehabilitation | Admitting: Physical Medicine & Rehabilitation

## 2024-10-03 DIAGNOSIS — M5416 Radiculopathy, lumbar region: Secondary | ICD-10-CM | POA: Insufficient documentation

## 2024-10-11 ENCOUNTER — Other Ambulatory Visit: Payer: Self-pay | Admitting: Physical Medicine & Rehabilitation

## 2024-10-11 DIAGNOSIS — M5416 Radiculopathy, lumbar region: Secondary | ICD-10-CM

## 2024-10-12 ENCOUNTER — Other Ambulatory Visit

## 2024-11-23 ENCOUNTER — Ambulatory Visit: Admitting: Physical Therapy

## 2024-11-23 ENCOUNTER — Encounter: Payer: Self-pay | Admitting: Physical Therapy

## 2024-11-23 DIAGNOSIS — M4125 Other idiopathic scoliosis, thoracolumbar region: Secondary | ICD-10-CM | POA: Insufficient documentation

## 2024-11-23 DIAGNOSIS — M7918 Myalgia, other site: Secondary | ICD-10-CM | POA: Diagnosis present

## 2024-11-23 DIAGNOSIS — G588 Other specified mononeuropathies: Secondary | ICD-10-CM | POA: Insufficient documentation

## 2024-11-23 DIAGNOSIS — R2689 Other abnormalities of gait and mobility: Secondary | ICD-10-CM

## 2024-11-23 DIAGNOSIS — M533 Sacrococcygeal disorders, not elsewhere classified: Secondary | ICD-10-CM | POA: Diagnosis present

## 2024-11-24 NOTE — Therapy (Signed)
 OUTPATIENT PHYSICAL THERAPY EVALUATION   Patient Name: Alejandro Williamson MRN: [id number] DOB:[birth year], 73 y.o., male Today's Date: 11/24/2024   PT End of Session - 11/23/24 1641     Visit Number 1    Number of Visits 10    Date for Recertification  02/01/25    PT Start Time 1550    PT Stop Time 1635    PT Time Calculation (min) 45 min    Activity Tolerance Patient tolerated treatment well;No increased pain    Behavior During Therapy [facility] for tasks assessed/performed          Past Medical History:  Diagnosis Date   Actinic keratosis    Basal cell carcinoma 04/09/2009, 11/18/2009   right med forehead 3cm sup to right medial brow - ED&C   Past Surgical History:  Procedure Laterality Date   [employer] Stimulator leads and pulse generator Implantation  Right    01/13/22 by Dr. Tobie at [facility] , placed at R low back to help L pudendal/ glut pain   HIP ARTHROPLASTY Left 04/16/2024   Procedure: HEMIARTHROPLASTY (BIPOLAR) HIP, POSTERIOR APPROACH FOR FRACTURE;  Surgeon: Cleotilde Barrio, MD;  Location: [facility] ORS;  Service: Orthopedics;  Laterality: Left;   Patient Active Problem List   Diagnosis Date Noted   Fall 04/19/2024   Absolute anemia 04/19/2024   Dyslipidemia 04/16/2024   Peripheral neuropathy 04/16/2024   Closed left hip fracture, initial encounter (HCC) 04/15/2024    PCP: Luis  REFERRING PROVIDER: Sharrie  REFERRING DIAG: pudendal neuralgia  Rationale for Evaluation and Treatment Rehabilitation  THERAPY DIAG:  Sacrococcygeal disorders, not elsewhere classified  Gluteal pain  Other abnormalities of gait and mobility  Pudendal neuralgia  Other idiopathic scoliosis, thoracolumbar region  ONSET DATE:   SUBJECTIVE:                                                                                                                                                                                           SUBJECTIVE STATEMENT at EVAL 11/23/24    B posterior thigh pain L > R only occurs with driving and sitting. Before PT at [facility], pain was 5-6/10.  After 6-8 months, pt pain decreased to 2-3/10.  Pt had a hip injury with femur Fx in May 2025.  After hip surgery L, pt did post surgery exercises at [facility] for one month and pain returned 5-6/10.   Pt returned to walking but not full steppage as prior to surgery     Pt worked with another PT at [facility] for the past year. Had 50% improvement until he broke his hip in May 2025. Pt  underwent hip arthoplasty on L     Pt was sedentary after his surgery for one month and he noticed the pain got better almost gone.  Once he started PT for a month at [facility] for the hip surgery but the exercises caused the hamstring pain again.   Pt was released from those surgery exercises. Pt returned to the therapist at [facility] once he was cleared by the surgeon to discontinue the post surgery exercises. Pt worked with PT at [facility] from 5 months after the surgery and posterior thigh pain is decreasing again .  Drive today to clinic: 6/89. Pt notices stinging sensation along the buttock while driving.     PERTINENT HISTORY:  Hx of femur Fx after a fall May 2025, had a femur head replacement , Hip arthroplasty 2025    Basal cell carcinoma 2010   [employer] Stimulator leads and pulse generator Implantation ( located at R lumbar to help L pain)     PAIN:  Are you having pain? Yes: see above  PRECAUTIONS: None  WEIGHT BEARING RESTRICTIONS: No  FALLS:  Has patient fallen in last 6 months? No  LIVING ENVIRONMENT: Lives with: wife Lives in:  Stairs:  Has following equipment at home:   OCCUPATION: retired   PLOF:   PATIENT GOALS:   Recently retired and he wants to drive without pain in order to see his grandkids in [place]     OBJECTIVE:  L shoulder, L iliac higher  pelvis,  1.12 m/s, pelvis sway to L , minimal posterior rotation of L thorax   Upright posture, no forward  head  /thoracic kyphosis , improvement from 3 years ago when was seen for pelvic PT in 2022      HOME EXERCISE PROGRAM: See pt instruction section    ASSESSMENT:  CLINICAL IMPRESSION: Pt is a   yo  who presents with  following issues which impact QOL, ADL,  fitness, social and community activities:      Pt's musculoskeletal assessment revealed uneven pelvic girdle and shoulder height, asymmetries to gait pattern, limited spinal /pelvic mobility, poor body mechanics which places strain on the abdominal/pelvic floor mm. These are deficits that indicate an ineffective intraabdominal pressure system associated with increased risk for pt's Sx.     Pt will benefit from propioception/ coordination/ body mechanics training and education with gravity-loaded tasks at work and home and  fitness modifications in order to gain a more effective intraabdominal pressure system to minimize Sx. Advised pt to not perform sit-ups and crunches as these movement patterns lead to more downward forces on the pelvic floor, negatively impacting abdominopelvic/spinal dysfunctions.   Pt was provided education on etiology of Sx with anatomy, physiology explanation with images along with the benefits of customized pelvic PT Tx based on pt's medical conditions and musculoskeletal deficits.  Explained the physiology of deep core mm coordination and roles of pelvic floor function/  lower kinetic chain ( LKC) , spinal/pelvic alignment for urination, defecation, sexual function, and postural control with deep core mm system, and the role of posture and alignment to help pelvic issues.    Following Tx today which pt tolerated without complaints,  pt demo'd proper body mechanics to minimize straining pelvic floor.  Plan to address realignment of spine/ pelvis at next session to help promote optimize intra-abdominal pressure system (IAP)  for improved pelvic floor function, trunk stability, gait, balance, stabilization with  mobility tasks.  Plan to address pelvic floor issues once pelvis and  spine are realigned to yield better outcomes.   Plan to perform sensory test along tarea where pt reports tingling sensation, assess hip surgery scar for restrictions  Regional interdependent approaches will yield greater benefits in pt's POC due to the complexity of pt's medical Hx and the significant impact their Sx have had on their QOL. Pt would benefit from a biopsychosocial approach to yield optimal outcomes. Plan to build interdisciplinary team with pt's providers to optimize patient-centered care as needed.                                                      Pt benefits from skilled PT.    OBJECTIVE IMPAIRMENTS decreased activity tolerance, decreased coordination, decreased endurance, decreased mobility, difficulty walking, decreased ROM, decreased strength, decreased safety awareness, hypomobility, increased muscle spasms, impaired flexibility, improper body mechanics, postural dysfunction, and pain. scar restrictions   ACTIVITY LIMITATIONS  sitting, driving     PARTICIPATION LIMITATIONS:  community, social  activities    PERSONAL FACTORS   affecting patient's functional outcome:    REHAB POTENTIAL: Good   CLINICAL DECISION MAKING: Evolving/moderate complexity   EVALUATION COMPLEXITY: Moderate    PATIENT EDUCATION:    Education details: Showed pt anatomy images. Explained muscles attachments/ connection, physiology of deep core system/ spinal- thoracic-pelvis-lower kinetic chain as they relate to pt's presentation, Sx, and past Hx. Explained what and how these areas of deficits need to be restored to balance and function    See Therapeutic activity / neuromuscular re-education section  Answered pt's questions.   Person educated: Patient Education method: Explanation, Demonstration, Tactile cues, Verbal cues, and Handouts Education comprehension: verbalized understanding, returned demonstration, verbal  cues required, tactile cues required, and needs further education     PLAN: PT FREQUENCY: 1x/week   PT DURATION: 10 weeks   PLANNED INTERVENTIONS:   Gait training;Stair training;Functional mobility training;DME Instruction;Therapeutic activities;Therapeutic exercise;Balance training;Neuromuscular re-education;Patient/family education;Vestibular;Visual/perceptual remediation/compensation;Passive range of motion;Moist Heat;Cryotherapy;Traction;Canalith Repostioning;Joint Manipulations;Manual lymph drainage;Manual techniques;Scar mobilization;Energy conservation;Dry needling;ADLs/Self Care Home Management;Biofeedback;Electrical Stimulation;Taping    PLAN FOR NEXT SESSION: See clinical impression for plan     GOALS: Goals reviewed with patient? Yes  SHORT TERM GOALS: Target date: 12/21/2024    Pt will demo IND with HEP                    Baseline: Not IND            Goal status: INITIAL   LONG TERM GOALS: Target date:02/01/2025    1.Pt will demo proper deep core coordination without chest breathing and optimal excursion of diaphragm/pelvic floor in order to promote spinal stability and pelvic floor function  Baseline: dyscoordination Goal status: INITIAL  2.  Pt will demo proper body mechanics in against gravity tasks and ADLs  work tasks, fitness  to minimize straining pelvic floor / back    Baseline: not IND, improper form that places strain on pelvic floor  Goal status: INITIAL    3. Pt will demo increased gait speed > 1.3 m/s with reciprocal gait pattern, longer stride length  in order to ambulate safely in community and return to fitness routine  Baseline:  Goal status: INITIAL    4. Pt will demo levelled pelvic girdle and shoulder height in order to progress to deep core strengthening HEP and restore mobility at spine, pelvis,  gait, posture minimize falls, and improve balance  Baseline:  Goal status: INITIAL   5. Pt will improve scar restrictions along hip surgery  scars to improve fascial mobility and hip mobility   Baseline:  Goal status: INITIAL  6.     Pia Lupe Plump, PT 11/24/2024, 6:27 PM

## 2024-11-30 ENCOUNTER — Ambulatory Visit: Admitting: Physical Therapy

## 2024-12-04 ENCOUNTER — Encounter: Admitting: Physical Therapy

## 2024-12-04 NOTE — Addendum Note (Signed)
 Addended by: PIA FISCAL on: 12/04/2024 11:49 AM   Modules accepted: Orders

## 2024-12-05 ENCOUNTER — Other Ambulatory Visit: Payer: Self-pay | Admitting: Emergency Medicine

## 2024-12-05 DIAGNOSIS — M7072 Other bursitis of hip, left hip: Secondary | ICD-10-CM

## 2024-12-05 DIAGNOSIS — M76899 Other specified enthesopathies of unspecified lower limb, excluding foot: Secondary | ICD-10-CM

## 2024-12-15 ENCOUNTER — Ambulatory Visit

## 2024-12-21 ENCOUNTER — Encounter: Admitting: Physical Therapy

## 2024-12-22 ENCOUNTER — Other Ambulatory Visit

## 2024-12-22 ENCOUNTER — Ambulatory Visit
Admission: RE | Admit: 2024-12-22 | Discharge: 2024-12-22 | Disposition: A | Source: Ambulatory Visit | Attending: Emergency Medicine | Admitting: Emergency Medicine

## 2024-12-22 DIAGNOSIS — M7072 Other bursitis of hip, left hip: Secondary | ICD-10-CM | POA: Insufficient documentation

## 2024-12-22 DIAGNOSIS — M76899 Other specified enthesopathies of unspecified lower limb, excluding foot: Secondary | ICD-10-CM | POA: Diagnosis present

## 2024-12-28 ENCOUNTER — Encounter: Admitting: Physical Therapy

## 2025-01-03 ENCOUNTER — Other Ambulatory Visit
Admission: RE | Admit: 2025-01-03 | Discharge: 2025-01-03 | Disposition: A | Discharge status: Home / Self Care | Source: Ambulatory Visit | Attending: Specialist | Admitting: Specialist

## 2025-01-03 DIAGNOSIS — M7062 Trochanteric bursitis, left hip: Secondary | ICD-10-CM | POA: Diagnosis present

## 2025-01-04 ENCOUNTER — Encounter: Admitting: Physical Therapy

## 2025-01-06 LAB — AEROBIC CULTURE W GRAM STAIN (SUPERFICIAL SPECIMEN)
Culture: NO GROWTH
Culture: NO GROWTH
Gram Stain: NONE SEEN
Gram Stain: NONE SEEN

## 2025-01-11 ENCOUNTER — Encounter: Admitting: Physical Therapy

## 2025-01-18 ENCOUNTER — Encounter: Admitting: Physical Therapy

## 2025-01-25 ENCOUNTER — Encounter: Admitting: Physical Therapy

## 2025-02-01 ENCOUNTER — Encounter: Admitting: Physical Therapy

## 2025-07-30 ENCOUNTER — Ambulatory Visit: Admitting: Dermatology
